# Patient Record
Sex: Male | Born: 1980 | Race: White | Hispanic: No | Marital: Married | Smoking: Never smoker
Health system: Southern US, Community
[De-identification: ages and names within clinical notes are randomized; demographics above are authoritative.]

## PROBLEM LIST (undated history)

## (undated) DIAGNOSIS — T7840XA Allergy, unspecified, initial encounter: Secondary | ICD-10-CM

## (undated) DIAGNOSIS — K219 Gastro-esophageal reflux disease without esophagitis: Secondary | ICD-10-CM

## (undated) HISTORY — PX: NASAL FRACTURE SURGERY: SHX718

## (undated) HISTORY — PX: VASECTOMY: SHX75

## (undated) HISTORY — DX: Allergy, unspecified, initial encounter: T78.40XA

## (undated) HISTORY — DX: Gastro-esophageal reflux disease without esophagitis: K21.9

---

## 2004-08-20 ENCOUNTER — Emergency Department (HOSPITAL_COMMUNITY): Admission: EM | Admit: 2004-08-20 | Discharge: 2004-08-20 | Payer: Self-pay | Admitting: Emergency Medicine

## 2006-03-30 ENCOUNTER — Emergency Department (HOSPITAL_COMMUNITY): Admission: EM | Admit: 2006-03-30 | Discharge: 2006-03-30 | Payer: Self-pay | Admitting: Emergency Medicine

## 2009-11-02 ENCOUNTER — Emergency Department (HOSPITAL_COMMUNITY): Admission: EM | Admit: 2009-11-02 | Discharge: 2009-11-02 | Payer: Self-pay | Admitting: Emergency Medicine

## 2011-04-30 ENCOUNTER — Ambulatory Visit: Payer: BC Managed Care – PPO

## 2011-04-30 ENCOUNTER — Ambulatory Visit (INDEPENDENT_AMBULATORY_CARE_PROVIDER_SITE_OTHER): Payer: BC Managed Care – PPO | Admitting: Family Medicine

## 2011-04-30 VITALS — BP 130/72 | HR 56 | Temp 98.6°F | Resp 16 | Ht 69.5 in | Wt 184.2 lb

## 2011-04-30 DIAGNOSIS — R079 Chest pain, unspecified: Secondary | ICD-10-CM

## 2011-04-30 DIAGNOSIS — R0789 Other chest pain: Secondary | ICD-10-CM

## 2011-04-30 DIAGNOSIS — M549 Dorsalgia, unspecified: Secondary | ICD-10-CM

## 2011-04-30 DIAGNOSIS — M546 Pain in thoracic spine: Secondary | ICD-10-CM

## 2011-04-30 MED ORDER — OXAPROZIN 600 MG PO TABS: ORAL_TABLET | ORAL | Status: DC

## 2011-04-30 NOTE — Progress Notes (Signed)
  Subjective:    Patient ID: Brendan Lawson, male    DOB: 1981-02-23, 31 y.o.   MRN: [id number]  HPI 31 year old white male who has been having upper back and chest pains since December or so. 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 He knows of no specific injury. He also works out at [employer] but that has never caused him any problems. He's been doing that for a long time. He has been doing the [employer] job for a long time also. When he gets up in the morning takes a deep breath it causes pain through the chest. He has not had any palpitations. No shortness of breath.   Review of Systems head neck abdomen all normal.     Objective:   Physical Exam  TMs normal. Throat clear. Neck supple without nodes palpable at this time. He said he felt a little knot on his left clavicle but I do not feel it. His chest is clear to auscultation heart regular without murmurs chest wall not particularly tender. He had a recent left axillary node but that is gone now.  [facility] reading (PRIMARY) by  Dr. Alwyn Ren Thoracic spine series was negative.  EKG has sinus bradycardia and mild sinus arrhythmia otherwise normal.     Assessment & Plan:  Upper thoracic and spine and chest pains, etiology uncertain.  EKG and thoracic spine films will be ordered and we will decide treatment accordingly.  He will be treated with NSAIDS. If he continues to have pain he may have to take a week off from work.

## 2011-04-30 NOTE — Patient Instructions (Signed)
Take Daypro 1 twice daily for 2 weeks. If pain persists we may have to have you take a little time off of work, but I hope that the medicine takes care of it. Current course.

## 2011-05-28 ENCOUNTER — Other Ambulatory Visit: Payer: Self-pay

## 2011-05-28 ENCOUNTER — Emergency Department (HOSPITAL_COMMUNITY): Payer: BC Managed Care – PPO

## 2011-05-28 ENCOUNTER — Encounter (HOSPITAL_COMMUNITY): Payer: Self-pay | Admitting: Emergency Medicine

## 2011-05-28 ENCOUNTER — Emergency Department (HOSPITAL_COMMUNITY)
Admission: EM | Admit: 2011-05-28 | Discharge: 2011-05-28 | Disposition: A | Discharge status: Home / Self Care | Payer: BC Managed Care – PPO | Attending: Emergency Medicine | Admitting: Emergency Medicine

## 2011-05-28 DIAGNOSIS — R799 Abnormal finding of blood chemistry, unspecified: Secondary | ICD-10-CM | POA: Insufficient documentation

## 2011-05-28 DIAGNOSIS — R079 Chest pain, unspecified: Secondary | ICD-10-CM | POA: Insufficient documentation

## 2011-05-28 DIAGNOSIS — R091 Pleurisy: Secondary | ICD-10-CM | POA: Insufficient documentation

## 2011-05-28 MED ORDER — HYDROCODONE-ACETAMINOPHEN 5-325 MG PO TABS: 1.0000 | ORAL_TABLET | Freq: Four times a day (QID) | ORAL | Status: AC | PRN

## 2011-05-28 MED ORDER — SODIUM CHLORIDE 0.9 % IV SOLN: INTRAVENOUS | Status: DC

## 2011-05-28 MED ORDER — IOHEXOL 300 MG/ML  SOLN
100.0000 mL | Freq: Once | INTRAMUSCULAR | Status: AC | PRN
  Administered 2011-05-28: 100 mL via INTRAVENOUS

## 2011-05-28 NOTE — Discharge Instructions (Signed)
Pleurisy  Pleurisy is an inflammation and swelling of the lining of the lungs. It usually is the result of an underlying infection or other disease. Because of this inflammation, it hurts to breathe. It is aggravated by coughing or deep breathing. The primary goal in treating pleurisy is to diagnose and treat the condition that caused it.   HOME CARE INSTRUCTIONS    Only take over-the-counter or prescription medicines for pain, discomfort, or fever as directed by your caregiver.   If medications which kill germs (antibiotics) were prescribed, take the entire course. Even if you are feeling better, you need to take them.   Use a cool mist vaporizer to help loosen secretions. This is so the secretions can be coughed up more easily.  SEEK MEDICAL CARE IF:    Your pain is not controlled with medication or is increasing.   You have an increase inpus like (purulent) secretions brought up with coughing.  SEEK IMMEDIATE MEDICAL CARE IF:    You have blue or dark lips, fingernails, or toenails.   You begin coughing up blood.   You have increased difficulty breathing.   You have continuing pain unrelieved by medicine or lasting more than 1 week.   You have pain that radiates into your neck, arms, or jaw.   You develop increased shortness of breath or wheezing.   You develop a fever, rash, vomiting, fainting, or other serious complaints.  Document Released: 03/07/2005 Document Revised: 02/24/2011 Document Reviewed: 10/06/2006  Exit[employer] Patient Information 2012 [employer].

## 2011-05-28 NOTE — ED Provider Notes (Signed)
History     CSN: [id number]  Arrival date & time 05/28/11  0101   First MD Initiated Contact with Patient 05/28/11 [phone]      Chief Complaint  Patient presents with  . Chest Pain    (Consider location/radiation/quality/duration/timing/severity/associated sxs/prior treatment) HPI This is a 31 year old white male with a 25 hour history of chest pain. He was playing video games just a morning at 3 AM when he developed the sudden onset of sharp, well localized pain just inferior to the right axilla. The pain is worse with palpation, movement or deep breathing. It is moderate to severe. He took some Naprosyn for it as well as 500 mg of amoxicillin. After taking the amoxicillin he he developed a itchy rash and thinks he may be allergic to the amoxicillin; he subsequently discarded the amoxicillin. He's been reading on the Internet and is concerned he may have a pulmonary embolism, bronchitis or pneumonia. He denies cough, fever or chills. He denies lower extremity pain or swelling.  History reviewed. No pertinent past medical history.  Past Surgical History  Procedure Date  . Nasal fracture surgery     Family History  Problem Relation Age of Onset  . Cancer Other     History  Substance Use Topics  . Smoking status: Former Games developer  . Smokeless tobacco: Not on file  . Alcohol Use: Yes     social       Review of Systems  All other systems reviewed and are negative.    Allergies  Amoxicillin  Home Medications   Current Outpatient Rx  Name Route Sig Dispense Refill  . AMOXICILLIN 500 MG PO CAPS Oral Take 500 mg by mouth 2 (two) times daily.    . OXAPROZIN 600 MG PO TABS Oral Take 600 mg by mouth 2 (two) times daily. Take 1 twice daily for back and chest wall pain and inflammation      BP 145/59  Pulse 72  Temp(Src) 98.7 F (37.1 C) (Oral)  Resp 16  SpO2 99%  Physical Exam General: Well-developed, well-nourished male in no acute distress; appearance consistent with  age of record HENT: normocephalic, atraumatic Eyes: Normal appearance Heart: regular rate and rhythm; no murmurs, rubs or gallops Lungs: clear to auscultation bilaterally Chest: Point tenderness inferior to right axilla without deformity or crepitus Abdomen: soft; nondistended; nontender Extremities: No deformity; full range of motion; pulses normal; no edema; no tenderness Neurologic: Awake, alert and oriented; motor function intact in all extremities and symmetric; no facial droop Skin: Warm and dry Psychiatric: Normal mood and affect    ED Course  Procedures (including critical care time)     MDM  EKG Interpretation:  Date & Time: 05/28/2011 1:27 AM  Rate: 75  Rhythm: normal sinus rhythm  QRS Axis: normal  Intervals: normal  ST/T Wave abnormalities: normal  Conduction Disutrbances:none  Narrative Interpretation:   Old EKG Reviewed: none available  Nursing notes and vitals signs, including pulse oximetry, reviewed.  Summary of this visit's results, reviewed by myself:  Labs:  Results for orders placed during the hospital encounter of 05/28/11  D-DIMER, QUANTITATIVE      Component Value Range   D-Dimer, Quant 11.36 (*) 0.00 - 0.48 (ug/mL-FEU)    Imaging Studies: Dg Chest 2 View  05/28/2011  *RADIOLOGY REPORT*  Clinical Data: Chest pain  CHEST - 2 VIEW  Comparison: None.  Findings: Lungs are clear. No pleural effusion or pneumothorax. The cardiomediastinal contours are within normal limits. The visualized bones  and soft tissues are without significant appreciable abnormality.  IMPRESSION: No acute process identified.  Original Report Authenticated By: Waneta Martins, M.D.   Ct Angio Chest W/cm &/or Wo Cm  05/28/2011  *RADIOLOGY REPORT*  Clinical Data: Chest pain, elevated D-dimer  CT ANGIOGRAPHY CHEST  Technique:  Multidetector CT imaging of the chest using the standard protocol during bolus administration of intravenous contrast. Multiplanar reconstructed images  including MIPs were obtained and reviewed to evaluate the vascular anatomy.  Contrast: OMNIPAQUE IOHEXOL 300 MG/ML IJ SOLN  Comparison: 05/28/2011 radiograph  Findings: No pulmonary arterial branch filling defect.  Normal heart size.  No pleural or pericardial effusion.  Normal course and caliber aorta.  No intrathoracic lymphadenopathy.  Clear lungs.  No pneumothorax.  Central airways are patent.  Limited images through the upper abdomen demonstrate no acute abnormality.  No acute osseous abnormality.  IMPRESSION: No pulmonary embolism or acute intrathoracic process identified.  Original Report Authenticated By: Waneta Martins, M.D.             Hanley Seamen, MD 05/28/11 [phone]

## 2011-05-28 NOTE — ED Notes (Signed)
Patient transported to CT 

## 2011-05-28 NOTE — ED Notes (Signed)
Patient transported to X-ray 

## 2011-05-28 NOTE — ED Notes (Signed)
Pt states he is having chest pain about 3am last night  Pt states it started suddenly  Pt states pain is worse with cough or take a deep breath  Pt states recently started taking oxaprozin 600mg  and he took some amoxicillin  Pt states he is having itching to his hands and feet

## 2012-04-25 ENCOUNTER — Emergency Department (HOSPITAL_COMMUNITY)
Admission: EM | Admit: 2012-04-25 | Discharge: 2012-04-25 | Disposition: A | Discharge status: Home / Self Care | Payer: BC Managed Care – PPO | Attending: Emergency Medicine | Admitting: Emergency Medicine

## 2012-04-25 ENCOUNTER — Encounter (HOSPITAL_COMMUNITY): Payer: Self-pay | Admitting: Emergency Medicine

## 2012-04-25 DIAGNOSIS — J039 Acute tonsillitis, unspecified: Secondary | ICD-10-CM | POA: Insufficient documentation

## 2012-04-25 MED ORDER — AZITHROMYCIN 250 MG PO TABS
500.0000 mg | ORAL_TABLET | Freq: Once | ORAL | Status: AC
  Administered 2012-04-25: 500 mg via ORAL
  Filled 2012-04-25: qty 2

## 2012-04-25 MED ORDER — AZITHROMYCIN 250 MG PO TABS: ORAL_TABLET | ORAL | Status: DC

## 2012-04-25 MED ORDER — PREDNISONE 20 MG PO TABS
60.0000 mg | ORAL_TABLET | Freq: Once | ORAL | Status: AC
  Administered 2012-04-25: 60 mg via ORAL
  Filled 2012-04-25: qty 3

## 2012-04-25 NOTE — ED Provider Notes (Signed)
Medical screening examination/treatment/procedure(s) were performed by non-physician practitioner and as supervising physician I was immediately available for consultation/collaboration.  Tallis Soledad, MD 04/25/12 1242 

## 2012-04-25 NOTE — ED Notes (Signed)
Pt reports that he was out of the country last week, and on a plane 5 days ago.reported GI discomfort and throat pain since he arrived home

## 2012-04-25 NOTE — ED Notes (Signed)
Pt reports throat pain x 2 days.Pt was not able to eat today

## 2012-04-25 NOTE — ED Provider Notes (Signed)
History     CSN: [id number]  Arrival date & time 04/25/12  1057   First MD Initiated Contact with Patient 04/25/12 1116      Chief Complaint  Patient presents with  . Sore Throat    pain in throat x 2 days    (Consider location/radiation/quality/duration/timing/severity/associated sxs/prior treatment) HPI Comments: 32 year old male presents to the emergency department complaining of sore throat beginning 5 days ago. Pain located on the right side of his throat, rated 8/10 worse with swallowing. Admits to associated fever and chills. Denies cough, chest pain, shortness of breath or GI symptoms. He recently returned from [place] 4 days ago. While he was in [place] he had "a GI bug". He has tried taking Alka-Seltzer, Motrin and Sudafed without relief.  Patient is a 31 y.o. male presenting with pharyngitis. The history is provided by the patient.  Sore Throat Associated symptoms include a sore throat. Pertinent negatives include no arthralgias, chest pain, myalgias, neck pain or rash.    History reviewed. No pertinent past medical history.  Past Surgical History  Procedure Date  . Nasal fracture surgery     Family History  Problem Relation Age of Onset  . Cancer Other     History  Substance Use Topics  . Smoking status: Never Smoker   . Smokeless tobacco: Not on file  . Alcohol Use: Yes     Comment: social       Review of Systems  HENT: Positive for sore throat and trouble swallowing. Negative for neck pain and neck stiffness.   Respiratory: Negative for shortness of breath.   Cardiovascular: Negative for chest pain.  Musculoskeletal: Negative for myalgias, back pain and arthralgias.  Skin: Negative for rash.  All other systems reviewed and are negative.    Allergies  Amoxicillin  Home Medications   Current Outpatient Rx  Name  Route  Sig  Dispense  Refill  . ASPIRIN EFFERVESCENT 325 MG PO TBEF   Oral   Take 325 mg by mouth every 6 (six) hours as needed.  Cold symptoms         . IBUPROFEN 200 MG PO TABS   Oral   Take 200 mg by mouth every 6 (six) hours as needed. pain         . PSEUDOEPHEDRINE HCL 30 MG PO TABS   Oral   Take 60 mg by mouth every 4 (four) hours as needed. Cold symptoms         . AZITHROMYCIN 250 MG PO TABS      Take 1 qd   4 tablet   0     BP 102/78  Pulse 104  Temp 99.6 F (37.6 C) (Oral)  Resp 20  SpO2 99%  Physical Exam  Nursing note and vitals reviewed. Constitutional: He is oriented to person, place, and time. He appears well-developed and well-nourished. No distress.  HENT:  Head: Normocephalic and atraumatic.  Mouth/Throat: Uvula is midline and mucous membranes are normal. Oropharyngeal exudate, posterior oropharyngeal edema and posterior oropharyngeal erythema present. No tonsillar abscesses.       R tonsil enlarged +3 with exudate. No tonsillar abscess.  Eyes: Conjunctivae normal and EOM are normal.  Neck: Normal range of motion. Neck supple.  Cardiovascular: Regular rhythm and normal heart sounds.  Tachycardia present.   Pulmonary/Chest: Effort normal and breath sounds normal. He has no wheezes. He has no rhonchi.  Musculoskeletal: Normal range of motion. He exhibits no edema.  Lymphadenopathy:    He has  cervical adenopathy.       Right cervical: Superficial cervical adenopathy present.  Neurological: He is alert and oriented to person, place, and time.  Skin: Skin is warm and dry.  Psychiatric: He has a normal mood and affect. His behavior is normal.    ED Course  Procedures (including critical care time)   Labs Reviewed  RAPID STREP SCREEN   No results found.   1. Tonsillitis       MDM  32 year old male with right-sided tonsillitis without evidence of abscess. Patient has a low-grade fever, no cough and tonsillar exit 8 meeting [facility] criteria treat for strep throat. He is allergic to amoxicillin, therefore I will place him on azithromycin. Return precautions discussed.  Patient states understanding of plan and is agreeable.        Trevor Mace, PA-C 04/25/12 1207  Trevor Mace, PA-C 04/25/12 1207

## 2012-08-06 IMAGING — CR DG CHEST 2V
3 series · 3 of 3 positions shown · non-contrast
Comparison: None.

CLINICAL DATA: Chest pain

CHEST - 2 VIEW

[w chest pa]
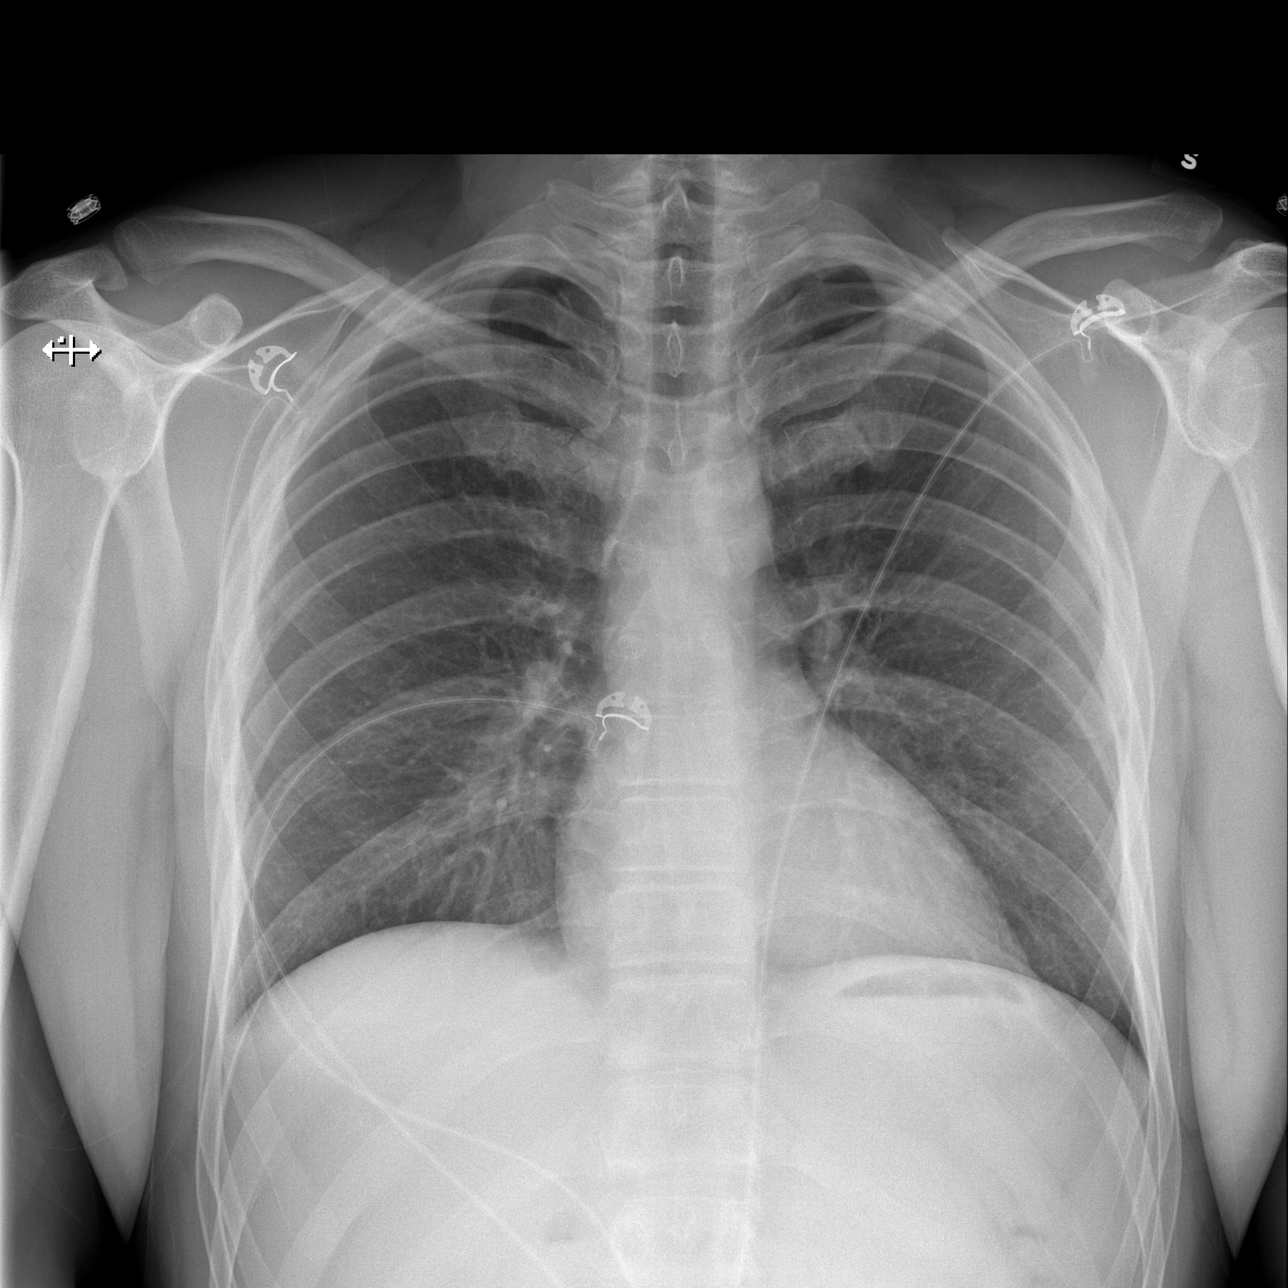

[w chest lat (1 of 2)]
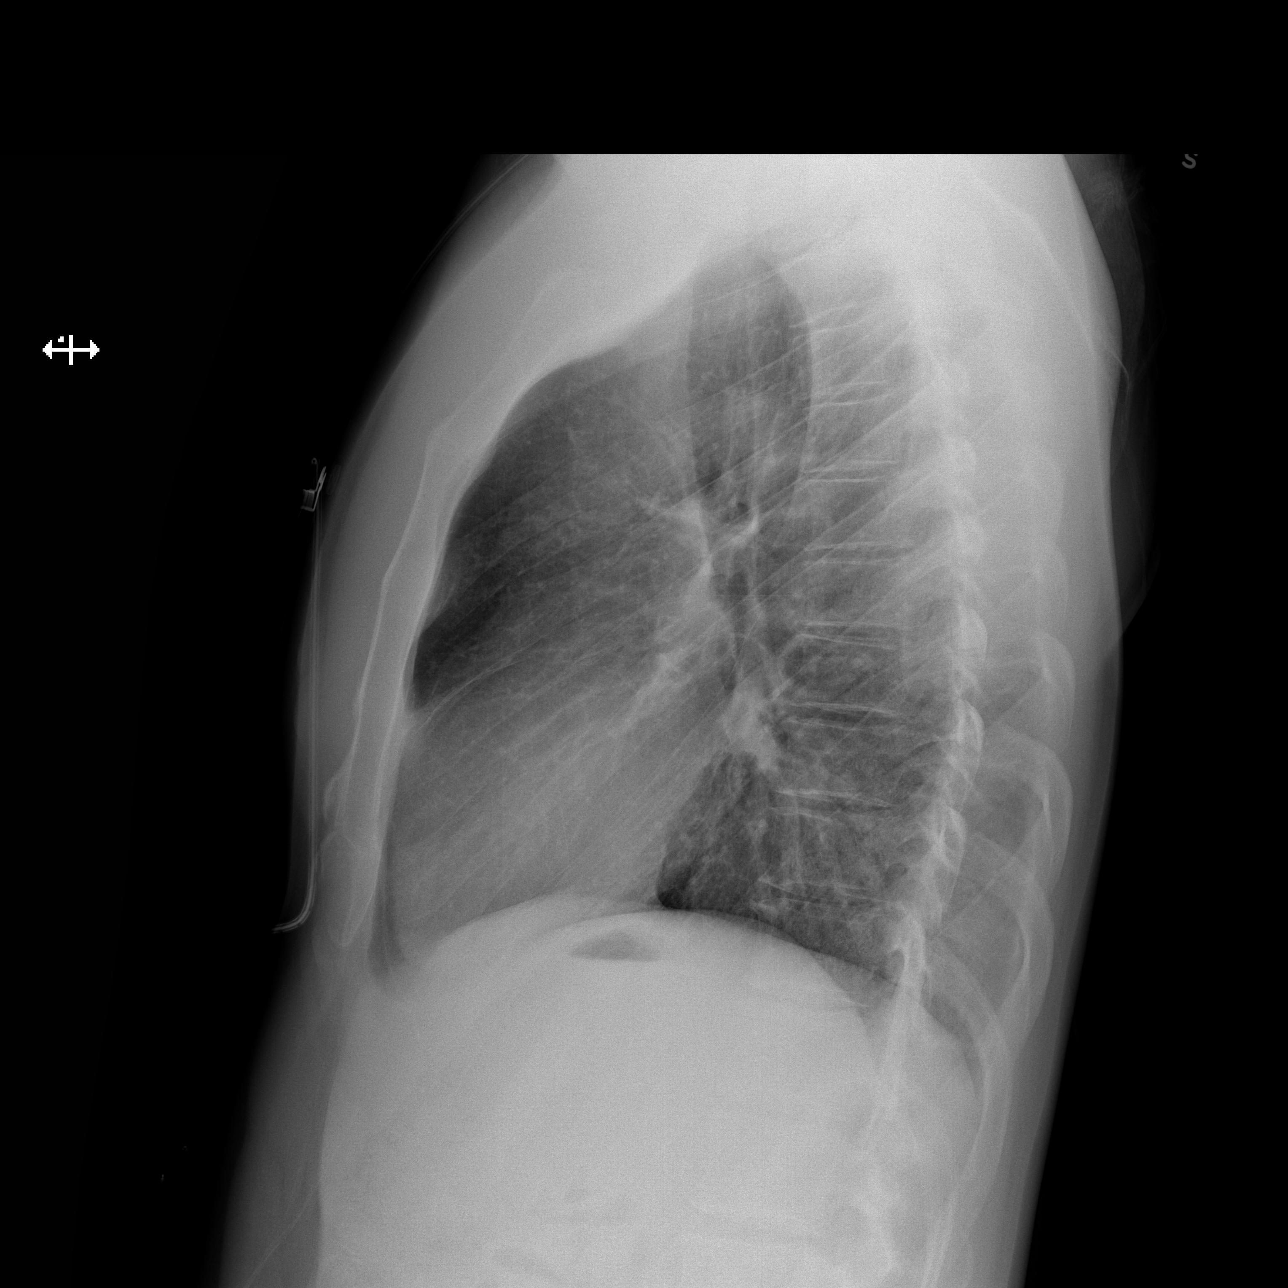

[w chest lat (2 of 2)]
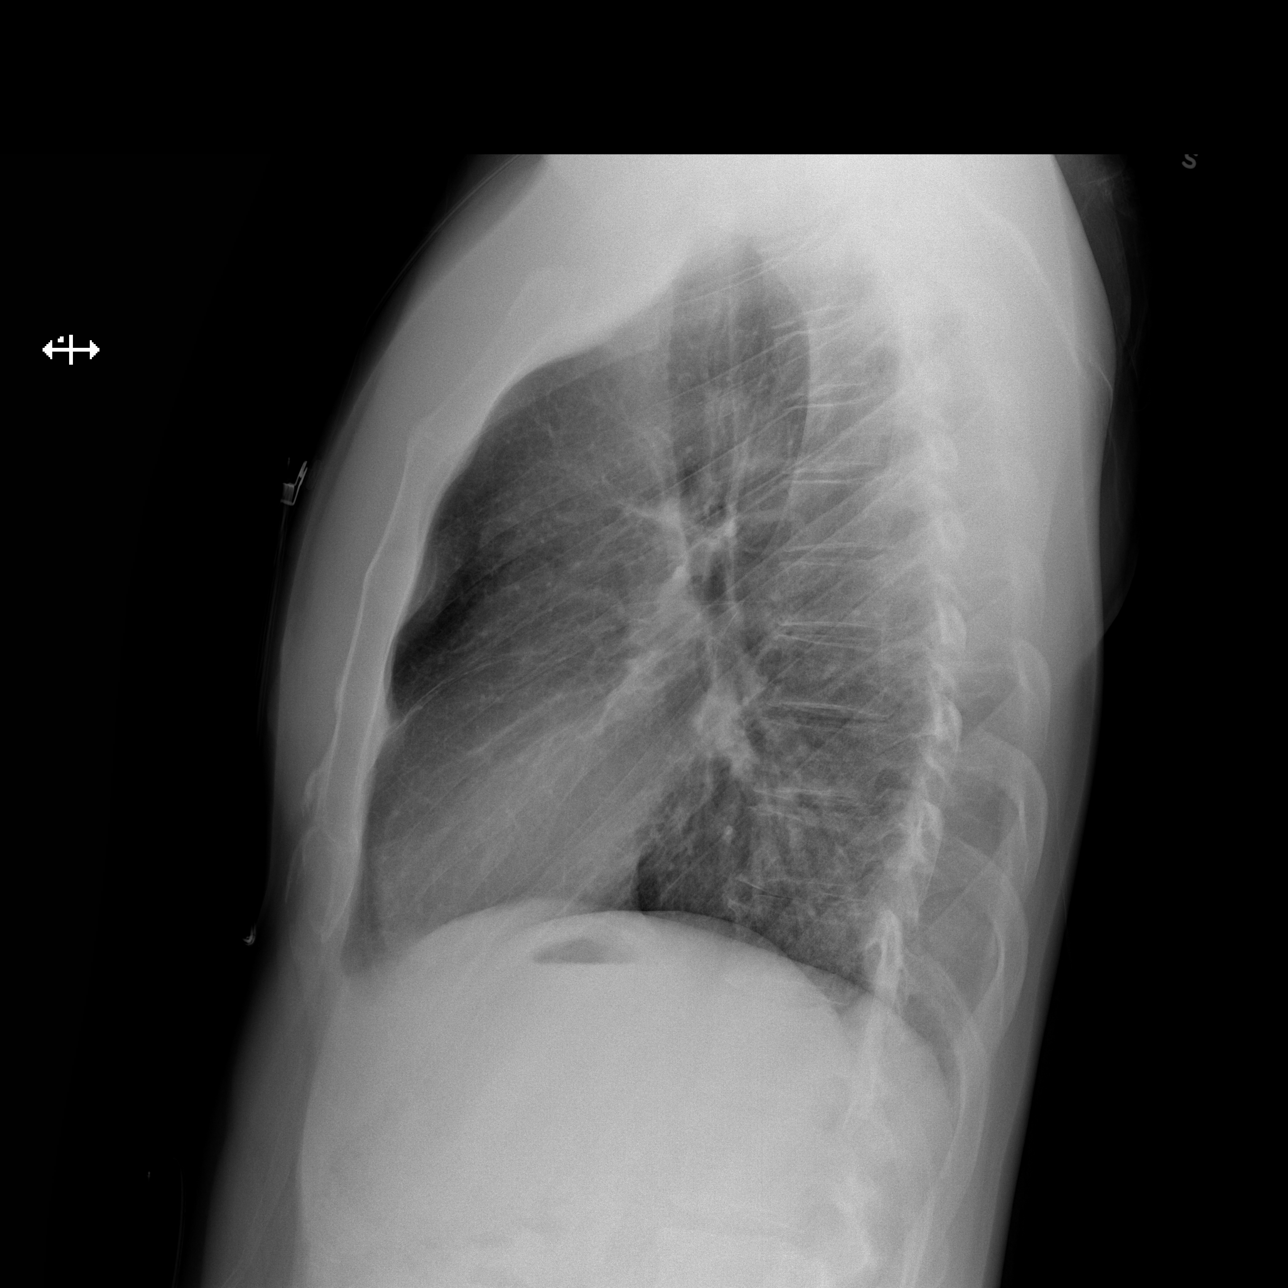

[3 of 3 positions shown; findings below may reference images not displayed]

FINDINGS: Lungs are clear. No pleural effusion or pneumothorax. The
cardiomediastinal contours are within normal limits. The visualized
bones and soft tissues are without significant appreciable
abnormality.
IMPRESSION: No acute process identified.

## 2013-01-15 ENCOUNTER — Ambulatory Visit (INDEPENDENT_AMBULATORY_CARE_PROVIDER_SITE_OTHER): Payer: BC Managed Care – PPO | Admitting: Emergency Medicine

## 2013-01-15 VITALS — BP 120/82 | HR 68 | Temp 98.1°F | Resp 16 | Ht 69.5 in | Wt 183.8 lb

## 2013-01-15 DIAGNOSIS — Z23 Encounter for immunization: Secondary | ICD-10-CM

## 2013-01-15 DIAGNOSIS — K120 Recurrent oral aphthae: Secondary | ICD-10-CM

## 2013-01-15 MED ORDER — FIRST-DUKES MOUTHWASH MT SUSP: 10.0000 mL | Freq: Four times a day (QID) | OROMUCOSAL | Status: DC

## 2013-01-15 MED ORDER — HYDROCODONE-ACETAMINOPHEN 5-325 MG PO TABS: 1.0000 | ORAL_TABLET | ORAL | Status: DC | PRN

## 2013-01-15 NOTE — Patient Instructions (Signed)
Canker Sores   Canker sores are painful, open sores on the inside of the mouth and cheek. They may be white or yellow. The sores usually heal in 1 to 2 weeks. Women are more likely than men to have recurrent canker sores.  CAUSES  The cause of canker sores is not well understood. More than one cause is likely. Canker sores do not appear to be caused by certain types of germs (viruses or bacteria). Canker sores may be caused by:   An allergic reaction to certain foods.   Digestive problems.   Not having enough vitamin B12, folic acid, and iron.   Male sex hormones. Sores may come only during certain phases of a menstrual cycle. Often, there is improvement during pregnancy.   Genetics. Some people seem to inherit canker sore problems.  Emotional stress and injuries to the mouth may trigger outbreaks, but not cause them.   DIAGNOSIS  Canker sores are diagnosed by exam.   TREATMENT   Patients who have frequent bouts of canker sores may have cultures taken of the sores, blood tests, or allergy tests. This helps determine if their sores are caused by a poor diet, an allergy, or some other preventable or treatable disease.   Vitamins may prevent recurrences or reduce the severity of canker sores in people with poor nutrition.   Numbing ointments can relieve pain. These are available in drug stores without a prescription.   Anti-inflammatory steroid mouth rinses or gels may be prescribed by your caregiver for severe sores.   Oral steroids may be prescribed if you have severe, recurrent canker sores. These strong medicines can cause many side effects and should be used only under the close direction of a dentist or physician.   Mouth rinses containing the antibiotic medicine may be prescribed. They may lessen symptoms and speed healing.  Healing usually happens in about 1 or 2 weeks with or without treatment. Certain antibiotic mouth rinses given to pregnant women and young children can permanently stain teeth.  Talk to your caregiver about your treatment.  HOME CARE INSTRUCTIONS    Avoid foods that cause canker sores for you.   Avoid citrus juices, spicy or salty foods, and coffee until the sores are healed.   Use a soft-bristled toothbrush.   Chew your food carefully to avoid biting your cheek.   Apply topical numbing medicine to the sore to help relieve pain.   Apply a thin paste of baking soda and water to the sore to help heal the sore.   Only use mouth rinses or medicines for pain or discomfort as directed by your caregiver.  SEEK MEDICAL CARE IF:    Your symptoms are not better in 1 week.   Your sores are still present after 2 weeks.   Your sores are very painful.   You have trouble breathing or swallowing.   Your sores come back frequently.  Document Released: 07/02/2010 Document Revised: 05/30/2011 Document Reviewed: 07/02/2010  [employer] Patient Information 2014 [employer].

## 2013-01-15 NOTE — Progress Notes (Signed)
[facility] [address], [place] [address] [phone]  Date:  01/15/2013   Name:  Brendan Lawson   DOB:  09/29/1980   MRN:  [id number]  PCP:  No primary provider on file.    Chief Complaint: Sore Throat and Groin Pain   History of Present Illness:  Brendan Lawson is a 32 y.o. very pleasant male patient who presents with the following:  Ill with sore throat started Thursday.  No cough or coryza.  No fever or chills.  Says pain is in the right side only and is associated with pressure in his right ear.  History of recurrent strep infections.  No improvement with over the counter medications or other home remedies. Has some pain in his left groin after lifting.  Denies other complaint or health concern today.   There are no active problems to display for this patient.   Past Medical History  Diagnosis Date  . Allergy     Past Surgical History  Procedure Laterality Date  . Nasal fracture surgery      History  Substance Use Topics  . Smoking status: Never Smoker   . Smokeless tobacco: Not on file  . Alcohol Use: Yes     Comment: social     Family History  Problem Relation Age of Onset  . Cancer Other   . Cancer Mother     breast   . Cancer Father     skin  . Cancer Maternal Grandfather     throat   . Heart disease Paternal Grandmother     Allergies  Allergen Reactions  . Amoxicillin [Amoxicillin] Hives    Medication list has been reviewed and updated.  Current Outpatient Prescriptions on File Prior to Visit  Medication Sig Dispense Refill  . aspirin-sod bicarb-citric acid (ALKA-SELTZER) 325 MG TBEF Take 325 mg by mouth every 6 (six) hours as needed. Cold symptoms      . azithromycin (ZITHROMAX Z-PAK) 250 MG tablet Take 1 qd  4 tablet  0  . ibuprofen (ADVIL,MOTRIN) 200 MG tablet Take 200 mg by mouth every 6 (six) hours as needed. pain      . pseudoephedrine (SUDAFED) 30 MG tablet Take 60 mg by mouth every 4 (four) hours as needed. Cold  symptoms       No current facility-administered medications on file prior to visit.    Review of Systems:  As per HPI, otherwise negative.    Physical Examination: Filed Vitals:   01/15/13 1506  BP: 120/82  Pulse: 68  Temp: 98.1 F (36.7 C)  Resp: 16   Filed Vitals:   01/15/13 1506  Height: 5' 9.5" (1.765 m)  Weight: 183 lb 12.8 oz (83.371 kg)   Body mass index is 26.76 kg/(m^2). Ideal Body Weight: Weight in (lb) to have BMI = 25: 171.4  GEN: WDWN, NAD, Non-toxic, A & O x 3 HEENT: Atraumatic, Normocephalic. Neck supple. No masses, No LAD.  Right tonsil is erythematous and not enlarged with a large aphthous ulceration. Ears and Nose: No external deformity. CV: RRR, No M/G/R. No JVD. No thrill. No extra heart sounds. PULM: CTA B, no wheezes, crackles, rhonchi. No retractions. No resp. distress. No accessory muscle use. ABD: S, NT, ND, +BS. No rebound. No HSM. EXTR: No c/c/e NEURO Normal gait.  PSYCH: Normally interactive. Conversant. Not depressed or anxious appearing.  Calm demeanor.  Genitalia: normal male circumcised. No hernia  Assessment and Plan: Aphthous stomatitis vicodin [employer]'s  magic mouthwash Flu shot Signed,  Phillips Odor, MD

## 2013-03-19 ENCOUNTER — Emergency Department (HOSPITAL_COMMUNITY)
Admission: EM | Admit: 2013-03-19 | Discharge: 2013-03-19 | Disposition: A | Discharge status: Home / Self Care | Payer: BC Managed Care – PPO | Attending: Emergency Medicine | Admitting: Emergency Medicine

## 2013-03-19 DIAGNOSIS — M545 Low back pain, unspecified: Secondary | ICD-10-CM | POA: Insufficient documentation

## 2013-03-19 DIAGNOSIS — Z79899 Other long term (current) drug therapy: Secondary | ICD-10-CM | POA: Insufficient documentation

## 2013-03-19 DIAGNOSIS — Z880 Allergy status to penicillin: Secondary | ICD-10-CM | POA: Insufficient documentation

## 2013-03-19 MED ORDER — IBUPROFEN 800 MG PO TABS: 800.0000 mg | ORAL_TABLET | Freq: Three times a day (TID) | ORAL | Status: DC | PRN

## 2013-03-19 MED ORDER — HYDROCODONE-ACETAMINOPHEN 5-325 MG PO TABS: 1.0000 | ORAL_TABLET | ORAL | Status: DC | PRN

## 2013-03-19 MED ORDER — METHOCARBAMOL 500 MG PO TABS: 500.0000 mg | ORAL_TABLET | Freq: Three times a day (TID) | ORAL | Status: DC | PRN

## 2013-03-19 NOTE — ED Provider Notes (Signed)
CSN: [id number]     Arrival date & time 03/19/13  1447 History  This chart was scribed for non-physician practitioner Trixie Dredge, PA-C working with Gerhard Munch, MD by Valera Castle, ED scribe. This patient was seen in room WTR5/WTR5 and the patient's care was started at 3:56 PM.   Chief Complaint  Patient presents with  . Back Pain     The history is provided by the patient. No language interpreter was used.   HPI Comments: Brendan Lawson is a 32 y.o. male who presents to the Emergency Department complaining of gradually worsening, severe, intermittent, central back pain, onset 1 week ago. He reports that at its worst the pain is a 10/10 severity. He denies any recent back injury. He reports a h/o back pain, including hearing popping sounds with different activities, but states that the pain usually goes away in 1 week. He reports that his pain this time has not subsided, and that yesterday and today have been the worst days pain wise this week. He states that movement, bending over exacerbates his back pain. He reports that he has been working a lot, doing double shifts. He states that his torso is crooked, bulging. He reports that nothing he has tried has helped relieve his abdominal asymmetry. He reports he has tried going to a chiropractor, with no relief, stating the pain neither improved or worsened. He reports taking Motrin yesterday and Hydrocodone today, with some relief stating that the pain severity is lowered to a 7/10. He denies fever, bladder and bowel incontinence, numbness, weakness, abdominal pain, hematuria, urinary frequency, and any other associated symptoms. He denies h/o IV drug use. He denies h/o cancer.   PCP - No primary provider on file.  Past Medical History  Diagnosis Date  . Allergy    Past Surgical History  Procedure Laterality Date  . Nasal fracture surgery     Family History  Problem Relation Age of Onset  . Cancer Other   . Cancer Mother     breast   .  Cancer Father     skin  . Cancer Maternal Grandfather     throat   . Heart disease Paternal Grandmother    History  Substance Use Topics  . Smoking status: Never Smoker   . Smokeless tobacco: Not on file  . Alcohol Use: Yes     Comment: social     Review of Systems  Constitutional: Negative for fever.  Gastrointestinal: Negative for abdominal pain.       Negative for bowel incontinence.  Genitourinary: Negative for frequency and hematuria.       Negative for bladder incontinence.  Musculoskeletal: Positive for back pain.  Neurological: Negative for weakness and numbness.    Allergies  Amoxicillin  Home Medications   Current Outpatient Rx  Name  Route  Sig  Dispense  Refill  . aspirin-sod bicarb-citric acid (ALKA-SELTZER) 325 MG TBEF   Oral   Take 325 mg by mouth every 6 (six) hours as needed. Cold symptoms         . azithromycin (ZITHROMAX Z-PAK) 250 MG tablet      Take 1 qd   4 tablet   0   . Diphenhyd-Hydrocort-Nystatin (FIRST-DUKES MOUTHWASH) SUSP   Mouth/Throat   Use as directed 10 mLs in the mouth or throat 4 (four) times daily.   237 mL   1   . HYDROcodone-acetaminophen (NORCO) 5-325 MG per tablet   Oral   Take 1-2 tablets by mouth  every 4 (four) hours as needed for pain.   30 tablet   0   . ibuprofen (ADVIL,MOTRIN) 200 MG tablet   Oral   Take 200 mg by mouth every 6 (six) hours as needed. pain         . pseudoephedrine (SUDAFED) 30 MG tablet   Oral   Take 60 mg by mouth every 4 (four) hours as needed. Cold symptoms          BP 122/79  Pulse 63  Temp(Src) 98.1 F (36.7 C) (Oral)  SpO2 98%  Physical Exam  Nursing note and vitals reviewed. Constitutional: He appears well-developed and well-nourished. No distress.  HENT:  Head: Normocephalic and atraumatic.  Neck: Neck supple.  Pulmonary/Chest: Effort normal.  Abdominal: Soft. There is no tenderness.  Musculoskeletal: Normal range of motion.  Spine nontender, no crepitus, or  stepoffs. Right paralumbarspinal spasm.  Neurological: He is alert. Gait normal.  Lower extremities:  Strength 5/5, sensation intact, distal pulses intact.    Skin: He is not diaphoretic.    ED Course  Procedures (including critical care time)  DIAGNOSTIC STUDIES: Oxygen Saturation is 98% on room air, normal by my interpretation.    COORDINATION OF CARE: 4:01 PM-Discussed treatment plan which includes muscle relaxer with pt at bedside and pt agreed to plan. Advised pt to f/u with PCP if pain worsens.   Labs Review Labs Reviewed - No data to display Imaging Review No results found.  EKG Interpretation   None      No orders of the defined types were placed in this encounter.    MDM   1. Low back pain    Pt with lower back pain, worse with movement, resulting from several days of double shifts at [employer] and heavy lifting.  Neurovascuarly intact.  No red flags.  Not worsened by chiropractic involvement, doubt injury from this.  Discussed result, findings, treatment, and follow up  with patient.  Pt given return precautions.  Pt verbalizes understanding and agrees with plan.        I personally performed the services described in this documentation, which was scribed in my presence. The recorded information has been reviewed and is accurate.   Trixie Dredge, PA-C 03/19/13 1728

## 2013-03-19 NOTE — ED Provider Notes (Signed)
  Medical screening examination/treatment/procedure(s) were performed by non-physician practitioner and as supervising physician I was immediately available for consultation/collaboration.  EKG Interpretation   None          Gerhard Munch, MD 03/19/13 2123

## 2013-03-19 NOTE — ED Notes (Signed)
Pt states that he has had back pain in the past but it usually goes away in one week. States it has been going on one week and his R calf cramps up.

## 2013-03-22 ENCOUNTER — Encounter (HOSPITAL_COMMUNITY): Payer: Self-pay | Admitting: Emergency Medicine

## 2013-05-17 ENCOUNTER — Ambulatory Visit (INDEPENDENT_AMBULATORY_CARE_PROVIDER_SITE_OTHER): Payer: BC Managed Care – PPO | Admitting: Family Medicine

## 2013-05-17 VITALS — BP 118/72 | HR 72 | Temp 97.8°F | Resp 18 | Ht 69.5 in | Wt 189.0 lb

## 2013-05-17 DIAGNOSIS — J0100 Acute maxillary sinusitis, unspecified: Secondary | ICD-10-CM

## 2013-05-17 MED ORDER — IBUPROFEN 800 MG PO TABS: 800.0000 mg | ORAL_TABLET | Freq: Three times a day (TID) | ORAL | Status: DC | PRN

## 2013-05-17 MED ORDER — DOXYCYCLINE HYCLATE 100 MG PO CAPS: 100.0000 mg | ORAL_CAPSULE | Freq: Two times a day (BID) | ORAL | Status: DC

## 2013-05-17 MED ORDER — IPRATROPIUM BROMIDE 0.03 % NA SOLN: 2.0000 | Freq: Two times a day (BID) | NASAL | Status: DC

## 2013-05-17 NOTE — Progress Notes (Signed)
Subjective:    Patient ID: Brendan Lawson, male    DOB: [birth year], 33 y.o.   MRN: [id number] This chart was scribed for Ethelda Chick, MD by Valera Castle, ED Scribe. This patient was seen in room 03 and the patient's care was started at 10:35 AM.  05/17/2013  Sinusitis  HPI Brendan Lawson is a 33 y.o. male who presents to the [facility] with sinusitis symptoms, including dry cough and rhinorrhea with thick drainage, onset 2 weeks ago. He reports recently visiting [place], [place], stating flying exacerbated his symptoms. He reports cough and rhinorrhea have persisted since returning from his trip, and reports associated sinus pressure/pain mainly on the left side, watery eyes, bilateral ear popping, headache, mild right sided sore throat, and SOB. He reports h/o intermittent mouth sores/blisters, has had some recently. His wife states he was red and warm last night, but is not sure if he had fever.   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 Pt reports trying Mucinex before work a few days ago, with little relief, but states he only took 2 tablets. He took 800 mg Motrin this morning, with some relief. He reports feeling better today, including his eyes, ears, and sinuses. He denies chills, sweats, and any other associated symptoms.   He also reports h/o of a small bead sized lump underneath his right jaw. He has h/o nose fx surgery. He reports his mother has h/o breast cancer and his father has h/o skin cancer. He recently got married, is without kids. He denies h/o smoking, drug use, but reports ocassional EtOH use.  Review of Systems  Constitutional: Negative for fever, chills and diaphoresis.  HENT: Positive for congestion, postnasal drip, rhinorrhea, sinus pressure and sore throat (mainly right sided). Negative for ear discharge, hearing loss and trouble swallowing.        + for bilateral ear popping  Eyes: Positive for discharge (watery). Negative  for photophobia, pain and visual disturbance.  Respiratory: Positive for cough and shortness of breath.   Gastrointestinal: Negative for nausea, vomiting and abdominal pain.  Musculoskeletal: Negative for myalgias.  Skin: Negative for rash.  Allergic/Immunologic: Positive for environmental allergies.  Neurological: Positive for headaches.    Past Medical History  Diagnosis Date  . Allergy    Allergies  Allergen Reactions  . Amoxicillin [Amoxicillin] Hives   Current Outpatient Prescriptions  Medication Sig Dispense Refill  . guaiFENesin (MUCINEX) 600 MG 12 hr tablet Take by mouth 2 (two) times daily.      . Multiple Vitamin (MULTIVITAMIN WITH MINERALS) TABS tablet Take 1 tablet by mouth daily.      Marland Kitchen doxycycline (VIBRAMYCIN) 100 MG capsule Take 1 capsule (100 mg total) by mouth 2 (two) times daily.  20 capsule  0  . ibuprofen (ADVIL,MOTRIN) 800 MG tablet Take 1 tablet (800 mg total) by mouth every 8 (eight) hours as needed.  30 tablet  0  . ipratropium (ATROVENT) 0.03 % nasal spray Place 2 sprays into the nose 2 (two) times daily.  30 mL  0   No current facility-administered medications for this visit.   History   Social History  . Marital Status: Married    Spouse Name: N/A    Number of Children: N/A  . Years of Education: N/A   Occupational History  . Not on file.   Social History Main Topics  . Smoking status: Never Smoker   . Smokeless tobacco: Not  on file  . Alcohol Use: Yes     Comment: social   . Drug Use: Yes    Special: Marijuana  . Sexual Activity: Not on file   Other Topics Concern  . 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      Tobacco: none      Alcohol:  Socially; weekends      Drugs: none       Objective:    BP 118/72  Pulse 72  Temp(Src) 97.8 F (36.6 C) (Oral)  Resp 18  Ht 5' 9.5" (1.765 m)  Wt 189 lb (85.73 kg)  BMI 27.52  kg/m2  SpO2 97%  Physical Exam  Nursing note and vitals reviewed. Constitutional: He is oriented to person, place, and time. He appears well-developed and well-nourished. No distress.  HENT:  Head: Normocephalic and atraumatic.  Right Ear: External ear normal.  Left Ear: External ear normal.  Nose: Right sinus exhibits maxillary sinus tenderness and frontal sinus tenderness. Left sinus exhibits maxillary sinus tenderness and frontal sinus tenderness.  Mouth/Throat: Uvula is midline and mucous membranes are normal. No oropharyngeal exudate or posterior oropharyngeal erythema.  Drainage in back of throat, no erythema, exudate. Diffuse sinus tenderness frontal and maxillary.   Eyes: Conjunctivae and EOM are normal. No scleral icterus.  Neck: Neck supple. No thyromegaly present.  Cardiovascular: Normal rate, regular rhythm and normal heart sounds.   No murmur heard. Pulmonary/Chest: Effort normal and breath sounds normal. No respiratory distress. He has no wheezes. He has no rales.  Musculoskeletal: Normal range of motion.  Lymphadenopathy:    He has no cervical adenopathy.  Neurological: He is alert and oriented to person, place, and time.  Skin: Skin is warm and dry.  Psychiatric: He has a normal mood and affect. His behavior is normal.       Assessment & Plan:  Acute maxillary sinusitis  1.  Acute Maxillary Sinusitis:  New.  Rx for Doxycycline, Atrovent nasal spray provided. Continue Mucinex bid.  RTC for persistent symptoms.  Continue Ibuprofen 800mg  tid; rx provided.  Meds ordered this encounter  Medications  . guaiFENesin (MUCINEX) 600 MG 12 hr tablet    Sig: Take by mouth 2 (two) times daily.  Marland Kitchen. doxycycline (VIBRAMYCIN) 100 MG capsule    Sig: Take 1 capsule (100 mg total) by mouth 2 (two) times daily.    Dispense:  20 capsule    Refill:  0  . ipratropium (ATROVENT) 0.03 % nasal spray    Sig: Place 2 sprays into the nose 2 (two) times daily.    Dispense:  30 mL    Refill:   0  . ibuprofen (ADVIL,MOTRIN) 800 MG tablet    Sig: Take 1 tablet (800 mg total) by mouth every 8 (eight) hours as needed.    Dispense:  30 tablet    Refill:  0    No Follow-up on file.  I personally performed the services described in this documentation, which was scribed in my presence.  The recorded information has been reviewed and is accurate.  Nilda SimmerKristi Robertta Halfhill, M.D.  [facility]  [place] [address] [place], [place]  [id number] [phone] phone [phone] fax

## 2013-05-17 NOTE — Patient Instructions (Signed)

## 2016-02-17 ENCOUNTER — Ambulatory Visit (INDEPENDENT_AMBULATORY_CARE_PROVIDER_SITE_OTHER): Payer: BLUE CROSS/BLUE SHIELD | Admitting: Family Medicine

## 2016-02-17 ENCOUNTER — Ambulatory Visit (INDEPENDENT_AMBULATORY_CARE_PROVIDER_SITE_OTHER): Payer: BLUE CROSS/BLUE SHIELD

## 2016-02-17 VITALS — BP 148/76 | HR 76 | Temp 98.1°F | Ht 69.5 in | Wt 194.4 lb

## 2016-02-17 DIAGNOSIS — R059 Cough, unspecified: Secondary | ICD-10-CM

## 2016-02-17 DIAGNOSIS — R05 Cough: Secondary | ICD-10-CM

## 2016-02-17 NOTE — Patient Instructions (Addendum)
Take Alka selzer Cold and flu for cold symptoms Try Honey for sore throat Your Chest xray was normal If you have worsening fevers or symptoms return for evalution    IF you received an x-ray today, you will receive an invoice from [facility]. Please contact [facility] at [phone] with questions or concerns regarding your invoice.   IF you received labwork today, you will receive an invoice from [facility]. Please contact [facility] at [phone] with questions or concerns regarding your invoice.   Our billing staff will not be able to assist you with questions regarding bills from these companies.  You will be contacted with the lab results as soon as they are available. The fastest way to get your results is to activate your My Chart account. Instructions are located on the last page of this paperwork. If you have not heard from us regarding the results in 2 weeks, please contact this office.

## 2016-02-17 NOTE — Progress Notes (Signed)
   Brendan Lawson is a 35 y.o. male who presents to [facility] today for cough, congestion, throat pain  1.  Throat pain - last night turned his neck over the side of the bed, unsure if he leaned his throat against the corner of the bed and coughed. He then started to have neck pain - has mild sore throat 5 days ago when his other symptoms started but it had resolved - this AM he continued to have pain tenderness over his larynx - no fevers - no increased drooling, no difficulty swallowing - has been able to tolerate breakfast this AM  normally  2. Cough, congestion - started 5 days ago - as above no fevers - his son ws sick with similor symptoms a week prior - denies headache  - denies SOB   ROS as above.  Pertinently, no chest pain, palpitations,  Fever, Chills, Abd pain, N/V/D.   PMH reviewed. Patient is a nonsmoker.   Past Medical History:  Diagnosis Date  . Allergy    Past Surgical History:  Procedure Laterality Date  . NASAL FRACTURE SURGERY      Medications reviewed. Current Outpatient Prescriptions  Medication Sig Dispense Refill  . Multiple Vitamin (MULTIVITAMIN WITH MINERALS) TABS tablet Take 1 tablet by mouth daily.    Marland Kitchen. ibuprofen (ADVIL,MOTRIN) 800 MG tablet Take 1 tablet (800 mg total) by mouth every 8 (eight) hours as needed. (Patient not taking: Reported on 02/17/2016) 30 tablet 0  . ipratropium (ATROVENT) 0.03 % nasal spray Place 2 sprays into the nose 2 (two) times daily. (Patient not taking: Reported on 02/17/2016) 30 mL 0   No current facility-administered medications for this visit.      Physical Exam:  BP (!) 148/76 (BP Location: Right Arm, Patient Position: Sitting, Cuff Size: Small)   Pulse 76   Temp 98.1 F (36.7 C) (Oral)   Ht 5' 9.5" (1.765 m)   Wt 194 lb 6.4 oz (88.2 kg)   SpO2 97%   BMI 28.30 kg/m  Gen:  Alert, cooperative patient who appears stated age in no acute distress.  Vital signs reviewed. HEENT: EOMI,  MMM,  normal TMs and ear canals bilaterally, mo pharnngeal erythema, midline uvular, no pharyngeal swelling or exudates Pulm:  Clear to auscultation bilaterally with good air movement.  No wheezes or rales noted.   Cardiac:  Regular rate and rhythm without murmur auscultated.  Good S1/S2. MSK: mild midline laryngeal point tenderness, no ovelying erythema or induration Abd:  Soft/nondistended/nontender.  Good bowel sounds throughout all four quadrants.  No masses noted.  Exts: Non edematous BL  LE, warm and well perfused.   Assessment and Plan: Viral URI, throat pain 1.  Throat pain - Likely peri laryngeal muscular strain versus mild laryngeal soft tissue contusion. Less likelly infection or PTA given no fever and benign exam. Will manage conservatively. Tylenol or motrin PRN pain  2, Cough congestion - History and physical exam benign. CXR done today at patient's request was normal - Will use OTC cold and flu remedies - return precautions discussed   Alyssa A. Kennon RoundsHaney MD, MS Family Medicine Resident PGY-3 Pager [phone]

## 2019-06-12 ENCOUNTER — Ambulatory Visit (HOSPITAL_COMMUNITY)
Admission: EM | Admit: 2019-06-12 | Discharge: 2019-06-12 | Disposition: A | Discharge status: Home / Self Care | Payer: BC Managed Care – PPO

## 2019-06-12 ENCOUNTER — Encounter (HOSPITAL_COMMUNITY): Payer: Self-pay

## 2019-06-12 ENCOUNTER — Other Ambulatory Visit: Payer: Self-pay

## 2019-06-12 DIAGNOSIS — J020 Streptococcal pharyngitis: Secondary | ICD-10-CM | POA: Diagnosis not present

## 2019-06-12 LAB — POCT RAPID STREP A: Streptococcus, Group A Screen (Direct): POSITIVE — AB

## 2019-06-12 MED ORDER — AZITHROMYCIN 250 MG PO TABS: 500.0000 mg | ORAL_TABLET | Freq: Every day | ORAL | 0 refills | Status: AC

## 2019-06-12 NOTE — ED Provider Notes (Signed)
[place]    CSN: [id number] Arrival date & time: 06/12/19  1823      History   Chief Complaint Chief Complaint  Patient presents with  . Sore Throat    HPI Brendan Lawson is a 39 y.o. male no significant past medical history presenting today for evaluation of sore throat.  Patient notes that he began to feel sore throat and under the weather on Monday.  He has had headache as well as some body aches.  His main concern is a sore throat as he has a lot of pain with swallowing.  Reports mild associated congestion.  Denies cough.  Wife and family with similar symptoms, wife tested positive for strep today.  Has been using ibuprofen.  HPI  Past Medical History:  Diagnosis Date  . Allergy     There are no problems to display for this patient.   Past Surgical History:  Procedure Laterality Date  . NASAL FRACTURE SURGERY         Home Medications    Prior to Admission medications   Medication Sig Start Date End Date Taking? Authorizing Provider  azithromycin (ZITHROMAX) 250 MG tablet Take 2 tablets (500 mg total) by mouth daily for 5 days. Take first 2 tablets together, then 1 every day until finished. 06/12/19 06/17/19  Adriel Desrosier C, PA-C  ibuprofen (ADVIL,MOTRIN) 800 MG tablet Take 1 tablet (800 mg total) by mouth every 8 (eight) hours as needed. Patient not taking: Reported on 02/17/2016 05/17/13   Wardell Honour, MD  ipratropium (ATROVENT) 0.03 % nasal spray Place 2 sprays into the nose 2 (two) times daily. Patient not taking: Reported on 02/17/2016 05/17/13   Wardell Honour, MD  Multiple Vitamin (MULTIVITAMIN WITH MINERALS) TABS tablet Take 1 tablet by mouth daily.    [provider]  triamcinolone (KENALOG) 0.025 % cream APPLY A SMALL AMOUNT TO AFFECTED AREA TWICE A DAY APPLY TO FOREARMS AND STOMACH 02/01/19   [provider]    Family History Family History  Problem Relation Age of Onset  . Cancer Mother        breast   . Cancer  Father        skin  . Cancer Maternal Grandfather        throat   . Heart disease Paternal Grandmother   . Cancer Other     Social History Social History   Tobacco Use  . Smoking status: Never Smoker  . Smokeless tobacco: Never Used  Substance Use Topics  . Alcohol use: Yes    Comment: social   . Drug use: Yes    Types: Marijuana     Allergies   Amoxicillin [amoxicillin]   Review of Systems Review of Systems  Constitutional: Positive for fatigue. Negative for activity change, appetite change, chills and fever.  HENT: Positive for congestion, rhinorrhea and sore throat. Negative for ear pain, sinus pressure and trouble swallowing.   Eyes: Negative for discharge and redness.  Respiratory: Negative for cough, chest tightness and shortness of breath.   Cardiovascular: Negative for chest pain.  Gastrointestinal: Negative for abdominal pain, diarrhea, nausea and vomiting.  Musculoskeletal: Negative for myalgias.  Skin: Negative for rash.  Neurological: Positive for headaches. Negative for dizziness and light-headedness.     Physical Exam Triage Vital Signs ED Triage Vitals  Enc Vitals Group     BP 06/12/19 1846 134/87     Pulse Rate 06/12/19 1846 87     Resp 06/12/19 1846  18     Temp 06/12/19 1846 98.7 F (37.1 C)     Temp Source 06/12/19 1846 Oral     SpO2 06/12/19 1846 97 %     Weight --      Height --      Head Circumference --      Peak Flow --      Pain Score 06/12/19 1845 0     Pain Loc --      Pain Edu? --      Excl. in GC? --    No data found.  Updated Vital Signs BP 134/87 (BP Location: Left Arm)   Pulse 87   Temp 98.7 F (37.1 C) (Oral)   Resp 18   SpO2 97%   Visual Acuity Right Eye Distance:   Left Eye Distance:   Bilateral Distance:    Right Eye Near:   Left Eye Near:    Bilateral Near:     Physical Exam Vitals and nursing note reviewed.  Constitutional:      Appearance: He is well-developed.  HENT:     Head: Normocephalic and  atraumatic.     Ears:     Comments: Bilateral ears without tenderness to palpation of external auricle, tragus and mastoid, EAC's without erythema or swelling, TM's with good bony landmarks and cone of light. Non erythematous.     Mouth/Throat:     Comments: Oral mucosa pink and moist, no tonsillar enlargement or exudate, tonsils do appear erythematous bilaterally. Posterior pharynx patent and nonerythematous, no uvula deviation or swelling. Normal phonation.  Visible postnasal drainage in posterior pharynx Eyes:     Conjunctiva/sclera: Conjunctivae normal.  Cardiovascular:     Rate and Rhythm: Normal rate and regular rhythm.     Heart sounds: No murmur.  Pulmonary:     Effort: Pulmonary effort is normal. No respiratory distress.     Breath sounds: Normal breath sounds.     Comments: Breathing comfortably at rest, CTABL, no wheezing, rales or other adventitious sounds auscultated Abdominal:     Palpations: Abdomen is soft.     Tenderness: There is no abdominal tenderness.  Musculoskeletal:     Cervical back: Neck supple.  Skin:    General: Skin is warm and dry.  Neurological:     Mental Status: He is alert.      UC Treatments / Results  Labs (all labs ordered are listed, but only abnormal results are displayed) Labs Reviewed  POCT RAPID STREP A - Abnormal; Notable for the following components:      Result Value   Streptococcus, Group A Screen (Direct) POSITIVE (*)    All other components within normal limits    EKG   Radiology No results found.  Procedures Procedures (including critical care time)  Medications Ordered in UC Medications - No data to display  Initial Impression / Assessment and Plan / UC Course  I have reviewed the triage vital signs and the nursing notes.  Pertinent labs & imaging results that were available during my care of the patient were reviewed by me and considered in my medical decision making (see chart for details).     Strep test  positive, treated with azithromycin 500 mg daily for 5 days.  Discussed further symptomatic and supportive care as well.  Rest and push fluids.  Discussed strict return precautions. Patient verbalized understanding and is agreeable with plan.  Final Clinical Impressions(s) / UC Diagnoses   Final diagnoses:  Strep throat  Discharge Instructions     Sore Throat  Your rapid strep test was positive today. We will treat you for strep throat with an antibiotic. Please take Azithromycin as prescribed.   Please continue Tylenol or Ibuprofen for fever and pain. May try salt water gargles, cepacol lozenges, throat spray, or OTC cold relief medicine for throat discomfort. If you also have congestion take a daily anti-histamine like Zyrtec, Claritin, and a oral decongestant to help with post nasal drip that may be irritating your throat.   Stay hydrated and drink plenty of fluids to keep your throat coated relieve irritation.      ED Prescriptions    Medication Sig Dispense Auth. Provider   azithromycin (ZITHROMAX) 250 MG tablet Take 2 tablets (500 mg total) by mouth daily for 5 days. Take first 2 tablets together, then 1 every day until finished. 10 tablet Bentley Haralson, Hawi C, PA-C     PDMP not reviewed this encounter.   Lew Dawes, [place] 06/12/19 1926

## 2019-06-12 NOTE — Discharge Instructions (Signed)
Sore Throat  Your rapid strep test was positive today. We will treat you for strep throat with an antibiotic. Please take Azithromycin as prescribed.   Please continue Tylenol or Ibuprofen for fever and pain. May try salt water gargles, cepacol lozenges, throat spray, or OTC cold relief medicine for throat discomfort. If you also have congestion take a daily anti-histamine like Zyrtec, Claritin, and a oral decongestant to help with post nasal drip that may be irritating your throat.   Stay hydrated and drink plenty of fluids to keep your throat coated relieve irritation.

## 2019-06-12 NOTE — ED Triage Notes (Addendum)
Pt is here with a sore throat that started Sunday, states he has taken some meds of his wife(Azithromycin-2 250MG  tabs 2 hours ago), is requesting the same meds. States he does not want COVID testing & was just COVID vaccinated 06/07/2019.

## 2019-07-01 ENCOUNTER — Ambulatory Visit
Admission: EM | Admit: 2019-07-01 | Discharge: 2019-07-01 | Disposition: A | Discharge status: Home / Self Care | Payer: BC Managed Care – PPO

## 2019-07-01 DIAGNOSIS — R591 Generalized enlarged lymph nodes: Secondary | ICD-10-CM | POA: Diagnosis not present

## 2019-07-01 MED ORDER — AZELASTINE HCL 0.1 % NA SOLN: 2.0000 | Freq: Two times a day (BID) | NASAL | 0 refills | Status: DC

## 2019-07-01 MED ORDER — FLUTICASONE PROPIONATE 50 MCG/ACT NA SUSP: 2.0000 | Freq: Every day | NASAL | 0 refills | Status: DC

## 2019-07-01 NOTE — ED Provider Notes (Signed)
[facility]    CSN: [id number] Arrival date & time: 07/01/19  0914      History   Chief Complaint Chief Complaint  Patient presents with  . swollen glands to neck    HPI Brendan Lawson is a 39 y.o. male.   39 year old male comes in for right-sided neck pain from what he thinks is swollen lymph nodes starting 5 days ago.  He was treated for strep 2 weeks ago, states the symptoms resolved after taking antibiotics.  He had some congestion/postnasal drip 5 to 6 days ago that has significantly improved as well.  Denies current URI symptoms, fevers, dental pain, swelling of the throat.  He took ibuprofen 600 mg with some relief.     Past Medical History:  Diagnosis Date  . Allergy     There are no problems to display for this patient.   Past Surgical History:  Procedure Laterality Date  . NASAL FRACTURE SURGERY         Home Medications    Prior to Admission medications   Medication Sig Start Date End Date Taking? Authorizing Provider  esomeprazole (NEXIUM) 20 MG capsule Take 20 mg by mouth daily at 12 noon.   Yes [provider]  azelastine (ASTELIN) 0.1 % nasal spray Place 2 sprays into both nostrils 2 (two) times daily. 07/01/19   Cathie Hoops,  V, PA-C  fluticasone (FLONASE) 50 MCG/ACT nasal spray Place 2 sprays into both nostrils daily. 07/01/19   Cathie Hoops,  V, PA-C  ibuprofen (ADVIL,MOTRIN) 800 MG tablet Take 1 tablet (800 mg total) by mouth every 8 (eight) hours as needed. Patient not taking: Reported on 02/17/2016 05/17/13   Ethelda Chick, MD  Multiple Vitamin (MULTIVITAMIN WITH MINERALS) TABS tablet Take 1 tablet by mouth daily.    [provider]    Family History Family History  Problem Relation Age of Onset  . Cancer Mother        breast   . Cancer Father        skin  . Cancer Maternal Grandfather        throat   . Heart disease Paternal Grandmother   . Cancer Other     Social History Social History   Tobacco Use  . Smoking  status: Never Smoker  . Smokeless tobacco: Never Used  Substance Use Topics  . Alcohol use: Yes    Comment: social   . Drug use: Yes    Types: Marijuana     Allergies   Amoxicillin [amoxicillin]   Review of Systems Review of Systems  Reason unable to perform ROS: See HPI as above.     Physical Exam Triage Vital Signs ED Triage Vitals  Enc Vitals Group     BP 07/01/19 0925 137/89     Pulse Rate 07/01/19 0925 76     Resp 07/01/19 0925 16     Temp 07/01/19 0925 97.9 F (36.6 C)     Temp Source 07/01/19 0925 Oral     SpO2 07/01/19 0925 96 %     Weight --      Height --      Head Circumference --      Peak Flow --      Pain Score 07/01/19 0947 2     Pain Loc --      Pain Edu? --      Excl. in [place]? --    No data found.  Updated Vital Signs BP 137/89 (BP Location: Right  Arm)   Pulse 76   Temp 97.9 F (36.6 C) (Oral)   Resp 16   SpO2 96%   Physical Exam Constitutional:      General: He is not in acute distress.    Appearance: Normal appearance. He is not ill-appearing, toxic-appearing or diaphoretic.  HENT:     Head: Normocephalic and atraumatic.     Right Ear: Tympanic membrane, ear canal and external ear normal. Tympanic membrane is not erythematous or bulging.     Left Ear: Tympanic membrane, ear canal and external ear normal. Tympanic membrane is not erythematous or bulging.     Mouth/Throat:     Mouth: Mucous membranes are moist.     Pharynx: Oropharynx is clear. Uvula midline.  Cardiovascular:     Rate and Rhythm: Normal rate and regular rhythm.     Heart sounds: Normal heart sounds. No murmur. No friction rub. No gallop.   Pulmonary:     Effort: Pulmonary effort is normal. No accessory muscle usage, prolonged expiration, respiratory distress or retractions.     Comments: Lungs clear to auscultation without adventitious lung sounds. Musculoskeletal:     Cervical back: Normal range of motion and neck supple. No spinous process tenderness or muscular  tenderness.  Lymphadenopathy:     Cervical: Cervical adenopathy present.     Right cervical: Superficial cervical adenopathy present.     Left cervical: Superficial cervical adenopathy present.  Neurological:     General: No focal deficit present.     Mental Status: He is alert and oriented to person, place, and time.     UC Treatments / Results  Labs (all labs ordered are listed, but only abnormal results are displayed) Labs Reviewed - No data to display  EKG   Radiology No results found.  Procedures Procedures (including critical care time)  Medications Ordered in UC Medications - No data to display  Initial Impression / Assessment and Plan / UC Course  I have reviewed the triage vital signs and the nursing notes.  Pertinent labs & imaging results that were available during my care of the patient were reviewed by me and considered in my medical decision making (see chart for details).    Bilateral cervical lymphadenopathy with tenderness to palpation.  No erythema or warmth, low suspicion for lymphadenitis.  Patient recently diagnosed with strep throat.  Discussed to continue to monitor lymphadenopathy, continue NSAIDs to help with symptoms.  Return precautions given.  Patient expresses understanding and agrees to plan.  Final Clinical Impressions(s) / UC Diagnoses   Final diagnoses:  Lymphadenopathy of head and neck   ED Prescriptions    Medication Sig Dispense Auth. Provider   fluticasone (FLONASE) 50 MCG/ACT nasal spray Place 2 sprays into both nostrils daily. 1 g ,  V, PA-C   azelastine (ASTELIN) 0.1 % nasal spray Place 2 sprays into both nostrils 2 (two) times daily. 30 mL Ok Edwards, PA-C     PDMP not reviewed this encounter.   Ok Edwards, PA-C 07/01/19 1019

## 2019-07-01 NOTE — ED Triage Notes (Signed)
Pt c/o pain and swelling lymph nodes to rt side of neck with pressure to rt ear and rt lower teeth. States had strep throat 2wks ago.

## 2019-07-01 NOTE — Discharge Instructions (Signed)
Continue ibuprofen 600-800mg  three times a day to help with pain. This is most likely due to having infection 1-2 weeks ago. This should resolve on own in 1-2 months. If needed for allergies, can start flonase/azelastine for the symptoms. Follow up with PCP for further evaluation if symptoms not improving.

## 2019-08-02 ENCOUNTER — Encounter: Payer: Self-pay | Admitting: Emergency Medicine

## 2019-08-02 ENCOUNTER — Ambulatory Visit
Admission: EM | Admit: 2019-08-02 | Discharge: 2019-08-02 | Disposition: A | Discharge status: Home / Self Care | Payer: BC Managed Care – PPO | Attending: Physician Assistant | Admitting: Physician Assistant

## 2019-08-02 ENCOUNTER — Other Ambulatory Visit: Payer: Self-pay

## 2019-08-02 DIAGNOSIS — J029 Acute pharyngitis, unspecified: Secondary | ICD-10-CM | POA: Insufficient documentation

## 2019-08-02 NOTE — Discharge Instructions (Signed)
Rapid strep negative. COVID PCR testing ordered. I would like you to quarantine until testing results. You can take over the counter flonase/nasacort to help with nasal congestion/drainage. Tylenol/motrin for pain and fever. Keep hydrated, urine should be clear to pale yellow in color. If experiencing shortness of breath, trouble breathing, go to the emergency department for further evaluation needed.  

## 2019-08-02 NOTE — ED Provider Notes (Signed)
[facility]    CSN: [id number] Arrival date & time: 08/02/19  1515      History   Chief Complaint Chief Complaint  Patient presents with  . Sore Throat    HPI Mali D Muse is a 39 y.o. male.   39 year old male comes in for 3 day of URI symptoms. Has had sore throat, mild rhinorrhea. Denies cough. Denies fever, chills, body aches. Denies abdominal pain, nausea, vomiting, diarrhea. Denies shortness of breath, loss of taste/smell. COVID vaccine completed 06/2019     Past Medical History:  Diagnosis Date  . Allergy     There are no problems to display for this patient.   Past Surgical History:  Procedure Laterality Date  . NASAL FRACTURE SURGERY         Home Medications    Prior to Admission medications   Medication Sig Start Date End Date Taking? Authorizing Provider  azelastine (ASTELIN) 0.1 % nasal spray Place 2 sprays into both nostrils 2 (two) times daily. 07/01/19   Tasia Catchings, Elisandra Deshmukh V, PA-C  esomeprazole (NEXIUM) 20 MG capsule Take 20 mg by mouth daily at 12 noon.    [provider]  fluticasone (FLONASE) 50 MCG/ACT nasal spray Place 2 sprays into both nostrils daily. 07/01/19   Tasia Catchings, Madolyn Ackroyd V, PA-C  ibuprofen (ADVIL,MOTRIN) 800 MG tablet Take 1 tablet (800 mg total) by mouth every 8 (eight) hours as needed. Patient not taking: Reported on 02/17/2016 05/17/13   Wardell Honour, MD  Multiple Vitamin (MULTIVITAMIN WITH MINERALS) TABS tablet Take 1 tablet by mouth daily.    [provider]    Family History Family History  Problem Relation Age of Onset  . Cancer Mother        breast   . Cancer Father        skin  . Cancer Maternal Grandfather        throat   . Heart disease Paternal Grandmother   . Cancer Other     Social History Social History   Tobacco Use  . Smoking status: Never Smoker  . Smokeless tobacco: Never Used  Substance Use Topics  . Alcohol use: Yes    Comment: social   . Drug use: Yes    Types: Marijuana      Allergies   Amoxicillin [amoxicillin]   Review of Systems Review of Systems  Reason unable to perform ROS: See HPI as above.     Physical Exam Triage Vital Signs ED Triage Vitals  Enc Vitals Group     BP 08/02/19 1522 (!) 153/84     Pulse Rate 08/02/19 1522 67     Resp 08/02/19 1522 16     Temp 08/02/19 1522 98.2 F (36.8 C)     Temp Source 08/02/19 1522 Oral     SpO2 08/02/19 1522 96 %     Weight --      Height --      Head Circumference --      Peak Flow --      Pain Score 08/02/19 1525 6     Pain Loc --      Pain Edu? --      Excl. in [employer]? --    No data found.  Updated Vital Signs BP (!) 153/84 (BP Location: Left Arm)   Pulse 67   Temp 98.2 F (36.8 C) (Oral)   Resp 16   SpO2 96%     Physical Exam Constitutional:  General: He is not in acute distress.    Appearance: Normal appearance. He is not ill-appearing, toxic-appearing or diaphoretic.  HENT:     Head: Normocephalic and atraumatic.     Mouth/Throat:     Mouth: Mucous membranes are moist.     Pharynx: Oropharynx is clear. Uvula midline. No posterior oropharyngeal erythema.  Cardiovascular:     Rate and Rhythm: Normal rate and regular rhythm.     Heart sounds: Normal heart sounds. No murmur. No friction rub. No gallop.   Pulmonary:     Effort: Pulmonary effort is normal. No accessory muscle usage, prolonged expiration, respiratory distress or retractions.     Comments: Lungs clear to auscultation without adventitious lung sounds. Musculoskeletal:     Cervical back: Normal range of motion and neck supple.  Neurological:     General: No focal deficit present.     Mental Status: He is alert and oriented to person, place, and time.      UC Treatments / Results  Labs (all labs ordered are listed, but only abnormal results are displayed) Labs Reviewed  CULTURE, GROUP A STREP ([facility])  NOVEL CORONAVIRUS, NAA  POCT RAPID STREP A (OFFICE)    EKG   Radiology No results  found.  Procedures Procedures (including critical care time)  Medications Ordered in UC Medications - No data to display  Initial Impression / Assessment and Plan / UC Course  I have reviewed the triage vital signs and the nursing notes.  Pertinent labs & imaging results that were available during my care of the patient were reviewed by me and considered in my medical decision making (see chart for details).    Rapid strep negative. COVID PCR test ordered. Patient to quarantine until testing results return. No alarming signs on exam.  Patient speaking in full sentences without respiratory distress.  Symptomatic treatment discussed.  Push fluids.  Return precautions given.  Patient expresses understanding and agrees to plan.  Final Clinical Impressions(s) / UC Diagnoses   Final diagnoses:  Sore throat    ED Prescriptions    None     PDMP not reviewed this encounter.   Belinda Fisher, PA-C 08/02/19 1546

## 2019-08-02 NOTE — ED Triage Notes (Addendum)
Pt here for sore throat x 3 days; denies fever; pt sts runny nose also; pt sts has had both covid vaccines last given 4/20

## 2019-08-03 LAB — NOVEL CORONAVIRUS, NAA: SARS-CoV-2, NAA: NOT DETECTED

## 2019-08-03 LAB — SARS-COV-2, NAA 2 DAY TAT

## 2019-08-04 ENCOUNTER — Other Ambulatory Visit: Payer: Self-pay

## 2019-08-04 ENCOUNTER — Ambulatory Visit (HOSPITAL_COMMUNITY)
Admission: EM | Admit: 2019-08-04 | Discharge: 2019-08-04 | Disposition: A | Discharge status: Home / Self Care | Payer: BC Managed Care – PPO | Attending: Family Medicine | Admitting: Family Medicine

## 2019-08-04 ENCOUNTER — Encounter (HOSPITAL_COMMUNITY): Payer: Self-pay

## 2019-08-04 DIAGNOSIS — J029 Acute pharyngitis, unspecified: Secondary | ICD-10-CM | POA: Diagnosis not present

## 2019-08-04 LAB — POCT INFECTIOUS MONO SCREEN: Mono Screen: NEGATIVE

## 2019-08-04 NOTE — ED Triage Notes (Signed)
Pt present sore throat, symptom started 3 -7 days ago. Pt stated that he went to an urgent care on Friday and his test results came back negative for covid and strep. But his symptoms has progressively gotten worst. Pt is having difficulty swallowing.

## 2019-08-04 NOTE — ED Provider Notes (Signed)
[facility]   [id number] 08/04/19 Arrival Time: 1205  [id number] THROAT  SUBJECTIVE: History from: patient.  Brendan Lawson is a 39 y.o. male who presents with abrupt onset of sore throat for 1 week.  Denies sick exposure to Covid, strep, flu or mono, or precipitating event. He was tested for strep and Covid x 2 days ago and both were negative. Was seen at [facility] at [facility].  Has made no attempts to treat at home.  Symptoms are made worse with swallowing, but tolerating liquids and own secretions without difficulty. Denies previous symptoms in the past. Reports that he does have issues with ulcers in his mouth when he eats acidic foods. Takes nexium for this with relief. No other significant medical history.  Denies fever, chills, fatigue, ear pain, sinus pain, rhinorrhea, nasal congestion, cough, SOB, wheezing, chest pain, nausea, rash, changes in bowel or bladder habits.     ROS: As per HPI.  All other pertinent ROS negative.     Past Medical History:  Diagnosis Date  . Allergy    Past Surgical History:  Procedure Laterality Date  . NASAL FRACTURE SURGERY     Allergies  Allergen Reactions  . Amoxicillin [Amoxicillin] Hives   No current facility-administered medications on file prior to encounter.   Current Outpatient Medications on File Prior to Encounter  Medication Sig Dispense Refill  . azelastine (ASTELIN) 0.1 % nasal spray Place 2 sprays into both nostrils 2 (two) times daily. 30 mL 0  . esomeprazole (NEXIUM) 20 MG capsule Take 20 mg by mouth daily at 12 noon.    . fluticasone (FLONASE) 50 MCG/ACT nasal spray Place 2 sprays into both nostrils daily. 1 g 0  . ibuprofen (ADVIL,MOTRIN) 800 MG tablet Take 1 tablet (800 mg total) by mouth every 8 (eight) hours as needed. (Patient not taking: Reported on 02/17/2016) 30 tablet 0  . Multiple Vitamin (MULTIVITAMIN WITH MINERALS) TABS tablet Take 1 tablet by mouth daily.     Social History   Socioeconomic History  .  Marital status: Married    Spouse name: Not on file  . Number of children: Not on file  . Years of education: Not on file  . Highest education level: Not on file  Occupational History  . Not on file  Tobacco Use  . Smoking status: Never Smoker  . Smokeless tobacco: Never Used  Substance and Sexual Activity  . Alcohol use: Yes    Comment: social   . Drug use: Yes    Types: Marijuana  . Sexual activity: Yes    Birth control/protection: None    Comment: Married  Other Topics Concern  . Not on file  Social History Narrative   Marital status: married      Children: none      Employment:  Manufacturing engineer; also works in Clinical cytogeneticist.      Tobacco: none      Alcohol:  Socially; weekends      Drugs: none   Social Determinants of Health   Financial Resource Strain:   . Difficulty of Paying Living Expenses:   Food Insecurity:   . Worried About Programme researcher, broadcasting/film/video in the Last Year:   . Barista in the Last Year:   Transportation Needs:   . Freight forwarder (Medical):   Marland Kitchen Lack of Transportation (Non-Medical):   Physical Activity:   . Days of Exercise per Week:   . Minutes of Exercise per Session:  Stress:   . Feeling of Stress :   Social Connections:   . Frequency of Communication with Friends and Family:   . Frequency of Social Gatherings with Friends and Family:   . Attends Religious Services:   . Active Member of Clubs or Organizations:   . Attends Archivist Meetings:   Marland Kitchen Marital Status:   Intimate Partner Violence:   . Fear of Current or Ex-Partner:   . Emotionally Abused:   Marland Kitchen Physically Abused:   . Sexually Abused:    Family History  Problem Relation Age of Onset  . Cancer Mother        breast   . Cancer Father        skin  . Cancer Maternal Grandfather        throat   . Heart disease Paternal Grandmother   . Cancer Other     OBJECTIVE:  Vitals:   08/04/19 1301  BP: 140/87  Pulse: 78  Resp: 18  Temp: 98.4 F (36.9 C)    TempSrc: Oral  SpO2: 100%     General appearance: alert; appears fatigued, but nontoxic, speaking in full sentences and managing own secretions HEENT: NCAT; Ears: EACs clear, TMs pearly gray with visible cone of light, without erythema; Eyes: PERRL, EOMI grossly; Nose: no obvious rhinorrhea; Throat: oropharynx ulcerated, tonsils 1+ and mildly erythematous without white tonsillar exudates, uvula midline Neck: supple without LAD Lungs: CTA bilaterally without adventitious breath sounds; cough absent Heart: regular rate and rhythm.  Radial pulses 2+ symmetrical bilaterally Skin: warm and dry Psychological: alert and cooperative; normal mood and affect  LABS: No results found for this or any previous visit (from the past 24 hour(s)).   ASSESSMENT & PLAN:  1. Pharyngitis, unspecified etiology     No orders of the defined types were placed in this encounter.    Mono test  Take dose of liquid benadryl and take dose of Maalox immediately after, try this regimen for the next 2 days. Get plenty of rest and push fluids Drink warm or cool liquids, use throat lozenges, or popsicles to help alleviate symptoms Take OTC ibuprofen or tylenol as needed for pain Follow up with PCP if symptoms persists Return or go to ER if patient has any new or worsening symptoms such as fever, chills, nausea, vomiting, worsening sore throat, cough, abdominal pain, chest pain, changes in bowel or bladder habits.  Reviewed expectations re: course of current medical issues. Questions answered. Outlined signs and symptoms indicating need for more acute intervention. Patient verbalized understanding. After Visit Summary given.          Faustino Congress, NP 08/04/19 1418

## 2019-08-04 NOTE — Discharge Instructions (Signed)
I would like you to try a dose of liquid benadryl followed by a dose of Maalox.  This will help the inflammation in your throat as well as coat it to help protect the integrity of your esophagus.  Follow up with primary care or with this office as needed.

## 2019-08-07 LAB — CULTURE, GROUP A STREP (THRC)

## 2020-06-18 ENCOUNTER — Other Ambulatory Visit: Payer: Self-pay

## 2020-06-18 ENCOUNTER — Ambulatory Visit
Admission: EM | Admit: 2020-06-18 | Discharge: 2020-06-18 | Disposition: A | Discharge status: Home / Self Care | Payer: BC Managed Care – PPO | Attending: Family Medicine | Admitting: Family Medicine

## 2020-06-18 ENCOUNTER — Ambulatory Visit (INDEPENDENT_AMBULATORY_CARE_PROVIDER_SITE_OTHER): Payer: BC Managed Care – PPO

## 2020-06-18 DIAGNOSIS — M25551 Pain in right hip: Secondary | ICD-10-CM

## 2020-06-18 DIAGNOSIS — M25561 Pain in right knee: Secondary | ICD-10-CM | POA: Diagnosis not present

## 2020-06-18 DIAGNOSIS — W19XXXA Unspecified fall, initial encounter: Secondary | ICD-10-CM | POA: Diagnosis not present

## 2020-06-18 DIAGNOSIS — M7918 Myalgia, other site: Secondary | ICD-10-CM

## 2020-06-18 MED ORDER — PREDNISONE 20 MG PO TABS: 40.0000 mg | ORAL_TABLET | Freq: Every day | ORAL | 0 refills | Status: DC

## 2020-06-18 NOTE — ED Provider Notes (Signed)
  [facility]   [id number] 06/18/20 Arrival Time: 1606  ASSESSMENT & PLAN:  1. Right knee pain, unspecified chronicity   2. Right buttock pain     See my previous note today. Return for x-rays. Needed to clarify with insurance re: coverage. Desires imaging at this time.   I have personally viewed the imaging studies ordered this visit. No fractures appreciated. Mild degen changes. See radiology report. Discussed with pt. Proceed with plan in prev note.  Orders Placed This Encounter  Procedures  . DG Knee Complete 4 Views Right  . DG Hip Unilat W or Wo Pelvis 1 View Right         Mardella Layman, MD 06/18/20 [phone]

## 2020-06-18 NOTE — ED Triage Notes (Signed)
Pt c/o pain rt buttocks and behind rt knee off and on for past 3 months. States pain is mainly while standing. States slipped getting in tub 3 months ago.

## 2020-06-18 NOTE — ED Provider Notes (Signed)
[facility]   [id number] 06/18/20 Arrival Time: 1452  ASSESSMENT & PLAN:  1. Right knee pain, unspecified chronicity   2. Right buttock pain     Able to ambulate here and hemodynamically stable.  Begin trial of: Meds ordered this encounter  Medications  . predniSONE (DELTASONE) 20 MG tablet    Sig: Take 2 tablets (40 mg total) by mouth daily.    Dispense:  10 tablet    Refill:  0   Encourage ROM/movement as tolerated.  Recommend:  Follow-up Information    Schedule an appointment as soon as possible for a visit  with [facility].   Contact information: [address] [place] [address] [phone]              Reviewed expectations re: course of current medical issues. Questions answered. Outlined signs and symptoms indicating need for more acute intervention. Patient verbalized understanding. After Visit Summary given.   SUBJECTIVE: History from: patient.  Brendan Lawson is a 40 y.o. male who reports on/off R buttock and post R knee discomfort; no injury/trauma; worse with prolonged standing; did slip in tub 3 m ago; questions relation. No extremity sensation changes or weakness. Normal bowel/bladder habits. No OTC anti-inflammatories. Does not wake from sleep. No h/o similar. Works at [employer]; stands all shift.   OBJECTIVE:  Vitals:   06/18/20 1501  BP: (!) 148/74  Pulse: 64  Resp: 20  Temp: 97.9 F (36.6 C)  TempSrc: Oral  SpO2: 98%    General appearance: alert; no distress HEENT: [place]; AT Neck: supple with FROM; without midline tenderness CV: regular Lungs: unlabored respirations; speaks full sentences without difficulty Abdomen: soft, non-tender; non-distended Back: no specific tenderness to palpation; FROM at waist; no midline tenderness Extremities: without edema; symmetrical without gross deformities; normal ROM of bilateral LE; no specific pain over post knee Skin: warm and  dry Neurologic: normal gait; normal sensation and strength of bilateral LE Psychological: alert and cooperative; normal mood and affect   Allergies  Allergen Reactions  . Amoxicillin [Amoxicillin] Hives    Past Medical History:  Diagnosis Date  . Allergy    Social History   Socioeconomic History  . Marital status: Married    Spouse name: Not on file  . Number of children: Not on file  . Years of education: Not on file  . Highest education level: Not on file  Occupational History  . Not on file  Tobacco Use  . Smoking status: Never Smoker  . Smokeless tobacco: Never Used  Substance and Sexual Activity  . Alcohol use: Yes    Comment: social   . Drug use: Not Currently    Types: Marijuana  . Sexual activity: Yes    Birth control/protection: None    Comment: Married  Other Topics Concern  . Not on file  Social History Narrative   Marital status: married      Children: none      Employment:  Manufacturing engineer; also works in Clinical cytogeneticist.      Tobacco: none      Alcohol:  Socially; weekends      Drugs: none   Social Determinants of Health   Financial Resource Strain: Not on file  Food Insecurity: Not on file  Transportation Needs: Not on file  Physical Activity: Not on file  Stress: Not on file  Social Connections: Not on file  Intimate Partner Violence: Not on file   Family History  Problem Relation Age of Onset  . Cancer Mother        breast   . Cancer Father        skin  . Cancer Maternal Grandfather        throat   . Heart disease Paternal Grandmother   . Cancer Other    Past Surgical History:  Procedure Laterality Date  . NASAL FRACTURE SURGERY       Mardella Layman, MD 06/18/20 [phone]

## 2020-06-18 NOTE — ED Triage Notes (Signed)
Pt returned for leg xray

## 2020-07-01 ENCOUNTER — Encounter: Payer: Self-pay | Admitting: Nurse Practitioner

## 2020-07-01 ENCOUNTER — Ambulatory Visit: Payer: BC Managed Care – PPO | Admitting: Nurse Practitioner

## 2020-07-01 ENCOUNTER — Other Ambulatory Visit: Payer: Self-pay

## 2020-07-01 VITALS — BP 118/74 | HR 65 | Temp 99.8°F | Ht 69.0 in | Wt 202.4 lb

## 2020-07-01 DIAGNOSIS — Z7689 Persons encountering health services in other specified circumstances: Secondary | ICD-10-CM | POA: Diagnosis not present

## 2020-07-01 DIAGNOSIS — G959 Disease of spinal cord, unspecified: Secondary | ICD-10-CM | POA: Insufficient documentation

## 2020-07-01 DIAGNOSIS — M25571 Pain in right ankle and joints of right foot: Secondary | ICD-10-CM | POA: Diagnosis not present

## 2020-07-01 NOTE — Progress Notes (Signed)
New Patient Office Visit  Subjective:  Patient ID: Brendan D Doro, male    DOB: [birth year]  Age: 40 y.o. MRN: [id number]  CC:  Chief Complaint  Patient presents with  . New Patient (Initial Visit)    HPI Brendan Lawson presents to establish new primary care provider. He states that he does not remember the last time he had a "regular" provider. He states that, even as a kid, he did not have long-term, established care. He moved around every 2 years or so, as his dad was in the army. Today, he c/o sciatic nerve pain. States he does not really have pain in lower back. Pain starts in the area of right buttock and radiates down the right leg. Pain will often settle behind the right knee. This has been going on for about 3 months. States that sitting is fine. Standing in one position and leaning toward the right increases the pain. Activities requiring repetitive motion increase the pain. He states that he did go to  Urgent Care on 06/18/2020 after he fell, landing on right knee. They did x-ray of the right knee and right hip. The x-ray of the right hip revealed that there is minimal degenerative disc disease of the lumbar spine. He has not had further imaging for this.  Due to have general physical. Should have baseline fasting labs done.   Past Medical History:  Diagnosis Date  . Allergy   . GERD (gastroesophageal reflux disease)    [facility] 06/30/2020    Past Surgical History:  Procedure Laterality Date  . NASAL FRACTURE SURGERY    . VASECTOMY N/A    [facility] 06/30/2020    Family History  Problem Relation Age of Onset  . Cancer Mother        breast   . Cancer Father        skin  . Cancer Maternal Grandfather        throat   . Heart disease Paternal Grandmother   . Cancer Other     Social History   Socioeconomic History  . Marital status: Married    Spouse name: Not on file  . Number of children: Not on file  . Years of education: Not on file  . Highest education level: Not  on file  Occupational History  . Not on file  Tobacco Use  . Smoking status: Never Smoker  . Smokeless tobacco: Never Used  Substance and Sexual Activity  . Alcohol use: Yes    Comment: social   . Drug use: Never    Types: Marijuana  . Sexual activity: Yes    Partners: Female    Birth control/protection: None    Comment: Married  Other Topics Concern  . Not on file  Social History Narrative   Marital status: married      Children: none      Employment:  Manufacturing engineer; also works in Clinical cytogeneticist.      Tobacco: none      Alcohol:  Socially; weekends      Drugs: none   Social Determinants of Health   Financial Resource Strain: Not on file  Food Insecurity: Not on file  Transportation Needs: Not on file  Physical Activity: Not on file  Stress: Not on file  Social Connections: Not on file  Intimate Partner Violence: Not on file    ROS Review of Systems  Constitutional: Negative for activity change, chills and fever.  HENT: Negative for congestion, postnasal drip, rhinorrhea,  sinus pressure, sinus pain and sneezing.   Eyes: Negative.   Respiratory: Negative for cough, shortness of breath and wheezing.   Cardiovascular: Negative for chest pain and palpitations.  Gastrointestinal: Negative for constipation, diarrhea, nausea and vomiting.  Endocrine: Negative.   Genitourinary: Negative.   Musculoskeletal: Positive for arthralgias and myalgias. Negative for back pain.       Pain which starts in right but cheek and radiates into the back of the right knee. Worse with repetitive motion and standing. Better when sitting down and stretching.  Pain in right big toe. States he hit it hard, going up the stairs. Has caused him pain in the big joint of big toe.   Skin: Negative for rash.  Allergic/Immunologic: Negative for environmental allergies.  Neurological: Negative for dizziness, weakness and headaches.  Hematological: Negative for adenopathy.  Psychiatric/Behavioral: The  patient is not nervous/anxious.   All other systems reviewed and are negative.   Objective:   Today's Vitals   07/01/20 1108  BP: 118/74  Pulse: 65  Temp: 99.8 F (37.7 C)  SpO2: 96%  Weight: 202 lb 6.4 oz (91.8 kg)  Height: 5\' 9"  (1.753 m)   Body mass index is 29.89 kg/m.   Physical Exam Vitals and nursing note reviewed.  Constitutional:      Appearance: Normal appearance. He is well-developed.  HENT:     Head: Normocephalic and atraumatic.  Eyes:     Extraocular Movements: Extraocular movements intact.     Conjunctiva/sclera: Conjunctivae normal.     Pupils: Pupils are equal, round, and reactive to light.  Cardiovascular:     Rate and Rhythm: Normal rate and regular rhythm.     Pulses: Normal pulses.     Heart sounds: Normal heart sounds.  Pulmonary:     Effort: Pulmonary effort is normal.     Breath sounds: Normal breath sounds.  Abdominal:     Palpations: Abdomen is soft.  Musculoskeletal:        General: Normal range of motion.     Cervical back: Normal range of motion and neck supple.  Skin:    General: Skin is warm and dry.     Capillary Refill: Capillary refill takes less than 2 seconds.  Neurological:     General: No focal deficit present.     Mental Status: He is alert and oriented to person, place, and time.  Psychiatric:        Mood and Affect: Mood normal.        Behavior: Behavior normal.        Thought Content: Thought content normal.        Judgment: Judgment normal.     Assessment & Plan:  1. Encounter to establish care Appointment today to establish new primary care provider.   2. Disease of spinal cord (HCC) X-ray of right hip showing minimal degenerative disc disease. Will get MRI for further evaluation. Refer to orthopedics as indicated.  - MR Lumbar Spine Wo Contrast; Future  3. Pain in joint of right foot Pain in proximal joint of the right great toe. Hurts with increased weight on right foot. Gradually improving. Encouraged him  to ice and rest the foot when possible. If it worsens or persists, will get x-ray.   Problem List Items Addressed This Visit      Nervous and Auditory   Disease of spinal cord (HCC)   Relevant Orders   MR Lumbar Spine Wo Contrast     Other   Encounter to  establish care - Primary   Pain in joint of right foot      Outpatient Encounter Medications as of 07/01/2020  Medication Sig  . esomeprazole (NEXIUM) 20 MG capsule Take 20 mg by mouth daily at 12 noon.  . Multiple Vitamin (MULTIVITAMIN WITH MINERALS) TABS tablet Take 1 tablet by mouth daily.  . predniSONE (DELTASONE) 20 MG tablet Take 2 tablets (40 mg total) by mouth daily.   No facility-administered encounter medications on file as of 07/01/2020.   Time spent with the patient was approximately 30 minutes. This time included reviewing progress notes, labs, imaging studies, and discussing plan for follow up.   Follow-up: Return in about 4 weeks (around 07/29/2020) for general physical - fbw about a week before. Marland Kitchen   Carlean Jews, NP

## 2020-07-01 NOTE — Patient Instructions (Signed)
Sciatica  Sciatica is pain, weakness, tingling, or loss of feeling (numbness) along the sciatic nerve. The sciatic nerve starts in the lower back and goes down the back of each leg. Sciatica usually goes away on its own or with treatment. Sometimes, sciatica may come back (recur). What are the causes? This condition happens when the sciatic nerve is pinched or has pressure put on it. This may be the result of:  A disk in between the bones of the spine bulging out too far (herniated disk).  Changes in the spinal disks that occur with aging.  A condition that affects a muscle in the butt.  Extra bone growth near the sciatic nerve.  A break (fracture) of the area between your hip bones (pelvis).  Pregnancy.  Tumor. This is rare. What increases the risk? You are more likely to develop this condition if you:  Play sports that put pressure or stress on the spine.  Have poor strength and ease of movement (flexibility).  Have had a back injury in the past.  Have had back surgery.  Sit for long periods of time.  Do activities that involve bending or lifting over and over again.  Are very overweight (obese). What are the signs or symptoms? Symptoms can vary from mild to very bad. They may include:  Any of these problems in the lower back, leg, hip, or butt: ? Mild tingling, loss of feeling, or dull aches. ? Burning sensations. ? Sharp pains.  Loss of feeling in the back of the calf or the sole of the foot.  Leg weakness.  Very bad back pain that makes it hard to move. These symptoms may get worse when you cough, sneeze, or laugh. They may also get worse when you sit or stand for long periods of time. How is this treated? This condition often gets better without any treatment. However, treatment may include:  Changing or cutting back on physical activity when you have pain.  Doing exercises and stretching.  Putting ice or heat on the affected area.  Medicines that  help: ? To relieve pain and swelling. ? To relax your muscles.  Shots (injections) of medicines that help to relieve pain, irritation, and swelling.  Surgery. Follow these instructions at home: Medicines  Take over-the-counter and prescription medicines only as told by your doctor.  Ask your doctor if the medicine prescribed to you: ? Requires you to avoid driving or using heavy machinery. ? Can cause trouble pooping (constipation). You may need to take these steps to prevent or treat trouble pooping:  Drink enough fluids to keep your pee (urine) pale yellow.  Take over-the-counter or prescription medicines.  Eat foods that are high in fiber. These include beans, whole grains, and fresh fruits and vegetables.  Limit foods that are high in fat and sugar. These include fried or sweet foods. Managing pain  If told, put ice on the affected area. ? Put ice in a plastic bag. ? Place a towel between your skin and the bag. ? Leave the ice on for 20 minutes, 2-3 times a day.  If told, put heat on the affected area. Use the heat source that your doctor tells you to use, such as a moist heat pack or a heating pad. ? Place a towel between your skin and the heat source. ? Leave the heat on for 20-30 minutes. ? Remove the heat if your skin turns bright red. This is very important if you are unable to feel pain,   heat, or cold. You may have a greater risk of getting burned.      Activity  Return to your normal activities as told by your doctor. Ask your doctor what activities are safe for you.  Avoid activities that make your symptoms worse.  Take short rests during the day. ? When you rest for a long time, do some physical activity or stretching between periods of rest. ? Avoid sitting for a long time without moving. Get up and move around at least one time each hour.  Exercise and stretch regularly, as told by your doctor.  Do not lift anything that is heavier than 10 lb (4.5 kg)  while you have symptoms of sciatica. ? Avoid lifting heavy things even when you do not have symptoms. ? Avoid lifting heavy things over and over.  When you lift objects, always lift in a way that is safe for your body. To do this, you should: ? Bend your knees. ? Keep the object close to your body. ? Avoid twisting.   General instructions  Stay at a healthy weight.  Wear comfortable shoes that support your feet. Avoid wearing high heels.  Avoid sleeping on a mattress that is too soft or too hard. You might have less pain if you sleep on a mattress that is firm enough to support your back.  Keep all follow-up visits as told by your doctor. This is important. Contact a doctor if:  You have pain that: ? Wakes you up when you are sleeping. ? Gets worse when you lie down. ? Is worse than the pain you have had in the past. ? Lasts longer than 4 weeks.  You lose weight without trying. Get help right away if:  You cannot control when you pee (urinate) or poop (have a bowel movement).  You have weakness in any of these areas and it gets worse: ? Lower back. ? The area between your hip bones. ? Butt. ? Legs.  You have redness or swelling of your back.  You have a burning feeling when you pee. Summary  Sciatica is pain, weakness, tingling, or loss of feeling (numbness) along the sciatic nerve.  This condition happens when the sciatic nerve is pinched or has pressure put on it.  Sciatica can cause pain, tingling, or loss of feeling (numbness) in the lower back, legs, hips, and butt.  Treatment often includes rest, exercise, medicines, and putting ice or heat on the affected area. This information is not intended to replace advice given to you by your health care provider. Make sure you discuss any questions you have with your health care provider. Document Revised: 03/26/2018 Document Reviewed: 03/26/2018 Elsevier Patient Education  2021 Elsevier Inc.  

## 2020-07-16 ENCOUNTER — Other Ambulatory Visit: Payer: Self-pay

## 2020-07-16 DIAGNOSIS — Z0000 Encounter for general adult medical examination without abnormal findings: Secondary | ICD-10-CM

## 2020-07-22 ENCOUNTER — Other Ambulatory Visit: Payer: BC Managed Care – PPO

## 2020-07-22 ENCOUNTER — Other Ambulatory Visit: Payer: Self-pay

## 2020-07-22 DIAGNOSIS — G959 Disease of spinal cord, unspecified: Secondary | ICD-10-CM

## 2020-07-22 DIAGNOSIS — Z0000 Encounter for general adult medical examination without abnormal findings: Secondary | ICD-10-CM

## 2020-07-23 LAB — CBC
Hematocrit: 44.2 % (ref 37.5–51.0)
Hemoglobin: 15.3 g/dL (ref 13.0–17.7)
MCH: 28.8 pg (ref 26.6–33.0)
MCHC: 34.6 g/dL (ref 31.5–35.7)
MCV: 83 fL (ref 79–97)
Platelets: 253 10*3/uL (ref 150–450)
RBC: 5.31 x10E6/uL (ref 4.14–5.80)
RDW: 13.1 % (ref 11.6–15.4)
WBC: 5.4 10*3/uL (ref 3.4–10.8)

## 2020-07-23 LAB — COMPREHENSIVE METABOLIC PANEL
ALT: 25 IU/L (ref 0–44)
AST: 21 IU/L (ref 0–40)
Albumin/Globulin Ratio: 1.6 (ref 1.2–2.2)
Albumin: 4.3 g/dL (ref 4.0–5.0)
Alkaline Phosphatase: 48 IU/L (ref 44–121)
BUN/Creatinine Ratio: 12 (ref 9–20)
BUN: 13 mg/dL (ref 6–20)
Bilirubin Total: 0.4 mg/dL (ref 0.0–1.2)
CO2: 24 mmol/L (ref 20–29)
Calcium: 9.1 mg/dL (ref 8.7–10.2)
Chloride: 101 mmol/L (ref 96–106)
Creatinine, Ser: 1.05 mg/dL (ref 0.76–1.27)
Globulin, Total: 2.7 g/dL (ref 1.5–4.5)
Glucose: 94 mg/dL (ref 65–99)
Potassium: 4.2 mmol/L (ref 3.5–5.2)
Sodium: 138 mmol/L (ref 134–144)
Total Protein: 7 g/dL (ref 6.0–8.5)
eGFR: 93 mL/min/{1.73_m2} (ref >59)

## 2020-07-23 LAB — LIPID PANEL
Chol/HDL Ratio: 6.9 ratio — ABNORMAL HIGH (ref 0.0–5.0)
Cholesterol, Total: 201 mg/dL — ABNORMAL HIGH (ref 100–199)
HDL: 29 mg/dL — ABNORMAL LOW (ref >39)
LDL Chol Calc (NIH): 110 mg/dL — ABNORMAL HIGH (ref 0–99)
Triglycerides: 361 mg/dL — ABNORMAL HIGH (ref 0–149)
VLDL Cholesterol Cal: 62 mg/dL — ABNORMAL HIGH (ref 5–40)

## 2020-07-23 LAB — HEMOGLOBIN A1C
Est. average glucose Bld gHb Est-mCnc: 108 mg/dL
Hgb A1c MFr Bld: 5.4 % (ref 4.8–5.6)

## 2020-07-23 LAB — TSH: TSH: 2.31 u[IU]/mL (ref 0.450–4.500)

## 2020-07-23 NOTE — Progress Notes (Signed)
Elevated lipid panel. Discuss with patient at visit 07/29/2020.

## 2020-07-29 ENCOUNTER — Encounter: Payer: Self-pay | Admitting: Nurse Practitioner

## 2020-07-29 ENCOUNTER — Encounter: Payer: BC Managed Care – PPO | Admitting: Nurse Practitioner

## 2020-07-29 ENCOUNTER — Ambulatory Visit (INDEPENDENT_AMBULATORY_CARE_PROVIDER_SITE_OTHER): Payer: BC Managed Care – PPO | Admitting: Nurse Practitioner

## 2020-07-29 ENCOUNTER — Other Ambulatory Visit: Payer: Self-pay

## 2020-07-29 VITALS — BP 108/68 | HR 67 | Temp 98.0°F | Ht 69.0 in | Wt 202.9 lb

## 2020-07-29 DIAGNOSIS — L740 Miliaria rubra: Secondary | ICD-10-CM

## 2020-07-29 DIAGNOSIS — Z0001 Encounter for general adult medical examination with abnormal findings: Secondary | ICD-10-CM

## 2020-07-29 DIAGNOSIS — M5127 Other intervertebral disc displacement, lumbosacral region: Secondary | ICD-10-CM

## 2020-07-29 DIAGNOSIS — E782 Mixed hyperlipidemia: Secondary | ICD-10-CM

## 2020-07-29 NOTE — Progress Notes (Signed)
Established Patient Office Visit  Subjective:  Patient ID: Brendan Lawson, male    DOB: 12/08/1980  Age: 40 y.o. MRN: [id number]  CC:  Chief Complaint  Patient presents with  . Annual Exam    HPI Brendan D Rebert presents for annual physical. Had been having pain in right buttock, radiating to the right upper leg and behind the right knee. Previously had x-ray of right hip showed some degenerative disc disease in lumbar spine. He has had MRI of lumbar spine since his last visit. This did show right paracentral disc herniation at L5/S1 and mild disc bulge with small right paracentral disc protrusion at L4/L5. This is/was likely causing compression of spinal nerves, resulting in pain of right buttock and right upper leg. He states that this has recently been better. Has been feeling nearly normal. States that he has had a little time off from work and has been able to rest some. Offered referral to see sports medicine or spine specialist, however, patient wishes to hold off on referral for now. Would like to continue with exercises and home PT to improve pain. Will reassess at later date.  Labs done since he was last seen. Lipid panel was moderately elevated.   Lipid Panel     Component Value Date/Time   CHOL 201 (H) 07/22/2020 0855   TRIG 361 (H) 07/22/2020 0855   HDL 29 (L) 07/22/2020 0855   CHOLHDL 6.9 (H) 07/22/2020 0855   LDLCALC 110 (H) 07/22/2020 0855   LABVLDL 62 (H) 07/22/2020 0855   Discussed starting statin therapy versus diet and lifestyle modifications to help lower panel. For now, will try increasing cardiovascular exercises. Will consume a healthier diet. Encouraged him to add OTC fish oil to help lower lipids. Will recheck panel in 6 months and reassess need for statin therapy.  The patient has no new concerns or complaints today. He denies chest pain, chest pressure, or shortness of breath. He has good blood pressure. He denies headaches or changes in his vision. He denies  abdominal pain, nausea, vomiting, or changes in bowel or bladder habits.   Past Medical History:  Diagnosis Date  . Allergy   . GERD (gastroesophageal reflux disease)    [facility] 06/30/2020    Past Surgical History:  Procedure Laterality Date  . NASAL FRACTURE SURGERY    . VASECTOMY N/A    [facility] 06/30/2020    Family History  Problem Relation Age of Onset  . Cancer Mother        breast   . Cancer Father        skin  . Cancer Maternal Grandfather        throat   . Heart disease Paternal Grandmother   . Cancer Other     Social History   Socioeconomic History  . Marital status: Married    Spouse name: Not on file  . Number of children: Not on file  . Years of education: Not on file  . Highest education level: Not on file  Occupational History  . Not on file  Tobacco Use  . Smoking status: Never Smoker  . Smokeless tobacco: Never Used  Substance and Sexual Activity  . Alcohol use: Yes    Comment: social   . Drug use: Never    Types: Marijuana  . Sexual activity: Yes    Partners: Female    Birth control/protection: None    Comment: Married  Other Topics Concern  . Not on file  Social History  Narrative   Marital status: married      Children: none      Employment:  Public affairs consultant; also works in Radio broadcast assistant.      Tobacco: none      Alcohol:  Socially; weekends      Drugs: none   Social Determinants of Health   Financial Resource Strain: Not on file  Food Insecurity: Not on file  Transportation Needs: Not on file  Physical Activity: Not on file  Stress: Not on file  Social Connections: Not on file  Intimate Partner Violence: Not on file    Outpatient Medications Prior to Visit  Medication Sig Dispense Refill  . esomeprazole (NEXIUM) 20 MG capsule Take 20 mg by mouth daily at 12 noon.    . Multiple Vitamin (MULTIVITAMIN WITH MINERALS) TABS tablet Take 1 tablet by mouth daily.    . predniSONE (DELTASONE) 20 MG tablet Take 2 tablets (40 mg total)  by mouth daily. 10 tablet 0   No facility-administered medications prior to visit.    Allergies  Allergen Reactions  . Penicillins Hives and Itching  . Amoxicillin [Amoxicillin] Hives    ROS Review of Systems  Constitutional: Negative for activity change, chills and fever.  HENT: Negative for congestion, postnasal drip, rhinorrhea, sinus pressure, sinus pain and sneezing.   Eyes: Negative.   Respiratory: Negative for cough, shortness of breath and wheezing.   Cardiovascular: Negative for chest pain and palpitations.  Gastrointestinal: Negative for constipation, diarrhea, nausea and vomiting.  Endocrine: Negative for cold intolerance, heat intolerance, polydipsia and polyuria.  Genitourinary: Negative for frequency, hematuria, scrotal swelling and testicular pain.  Musculoskeletal: Positive for arthralgias and myalgias. Negative for back pain.       Improved pain in lower back and right leg.   Skin: Positive for rash.       Skin rash present when it gets hot outside. Mostly under his arms, between his thighs. Folds of skin of belly.   Allergic/Immunologic: Negative for environmental allergies.  Neurological: Negative for dizziness, weakness and headaches.  Hematological: Negative for adenopathy.  Psychiatric/Behavioral: The patient is not nervous/anxious.       Objective:    Physical Exam Vitals and nursing note reviewed.  Constitutional:      Appearance: Normal appearance. He is well-developed.  HENT:     Head: Normocephalic and atraumatic.     Right Ear: Ear canal and external ear normal.     Left Ear: Ear canal and external ear normal.     Nose: Nose normal.     Mouth/Throat:     Mouth: Mucous membranes are moist.     Pharynx: Oropharynx is clear.  Eyes:     Extraocular Movements: Extraocular movements intact.     Conjunctiva/sclera: Conjunctivae normal.     Pupils: Pupils are equal, round, and reactive to light.  Cardiovascular:     Rate and Rhythm: Normal rate  and regular rhythm.  Pulmonary:     Effort: Pulmonary effort is normal.     Breath sounds: Normal breath sounds.  Abdominal:     General: Bowel sounds are normal.     Palpations: Abdomen is soft. There is no mass.     Tenderness: There is no abdominal tenderness.     Hernia: No hernia is present.  Musculoskeletal:        General: Normal range of motion.     Cervical back: Normal range of motion and neck supple.  Skin:    General: Skin is warm  and dry.     Capillary Refill: Capillary refill takes less than 2 seconds.  Neurological:     General: No focal deficit present.     Mental Status: He is alert and oriented to person, place, and time.  Psychiatric:        Mood and Affect: Mood normal.        Behavior: Behavior normal.        Thought Content: Thought content normal.        Judgment: Judgment normal.     Today's Vitals   07/29/20 1531  BP: 108/68  Pulse: 67  Temp: 98 F (36.7 C)  SpO2: 96%  Weight: 202 lb 14.4 oz (92 kg)   Body mass index is 29.96 kg/m.   Wt Readings from Last 3 Encounters:  07/29/20 202 lb 14.4 oz (92 kg)  07/01/20 202 lb 6.4 oz (91.8 kg)  02/17/16 194 lb 6.4 oz (88.2 kg)     There are no preventive care reminders to display for this patient.  There are no preventive care reminders to display for this patient.  Lab Results  Component Value Date   TSH 2.310 07/22/2020   Lab Results  Component Value Date   WBC 5.4 07/22/2020   HGB 15.3 07/22/2020   HCT 44.2 07/22/2020   MCV 83 07/22/2020   PLT 253 07/22/2020   Lab Results  Component Value Date   NA 138 07/22/2020   K 4.2 07/22/2020   CO2 24 07/22/2020   GLUCOSE 94 07/22/2020   BUN 13 07/22/2020   CREATININE 1.05 07/22/2020   BILITOT 0.4 07/22/2020   ALKPHOS 48 07/22/2020   AST 21 07/22/2020   ALT 25 07/22/2020   PROT 7.0 07/22/2020   ALBUMIN 4.3 07/22/2020   CALCIUM 9.1 07/22/2020   EGFR 93 07/22/2020   Lab Results  Component Value Date   CHOL 201 (H) 07/22/2020    Lab Results  Component Value Date   HDL 29 (L) 07/22/2020   Lab Results  Component Value Date   LDLCALC 110 (H) 07/22/2020   Lab Results  Component Value Date   TRIG 361 (H) 07/22/2020   Lab Results  Component Value Date   CHOLHDL 6.9 (H) 07/22/2020   Lab Results  Component Value Date   HGBA1C 5.4 07/22/2020      Assessment & Plan:  1. Encounter for general adult medical examination with abnormal findings Appointment today for annual physical.   2. Herniation of intervertebral disc between L5 and S1 Reviewed MRI of the lumbar spine with the patient which did show herniation of L5/S1 intervertebral disc. Offered referral to neurosurgery or orthopedics. Patient does not wish to pursue this right now as symptoms are nearly resolved. Will reassess and supply referral if patient changes his mind.   3. Mixed hyperlipidemia Reviewed labs with patient showing elevation of lipid panel. Reviewed diet and lifestyle changes which could be made to help lower cholesterol without adding medications at this time. Written information provided to support discussion. Will recheck lipid panel in 6 months and treat as indicated.   4. Heat rash Recommend use of OTC lotrimin cream/ointment. Apply to all effected areas as indicated to treat heat rash. May try using anti-gfungal powder to keep all areas dry and prevent further heat rash from developing.   Problem List Items Addressed This Visit      Musculoskeletal and Integument   Herniation of intervertebral disc between L5 and S1   Heat rash  Other   Encounter for general adult medical examination with abnormal findings - Primary   Mixed hyperlipidemia        Follow-up: Return in about 6 months (around 01/29/2021) for high cholesterol - fasting lipid panel about 1 week prior.    Ronnell Freshwater, NP

## 2020-07-29 NOTE — Patient Instructions (Signed)
Herniated Disk  A herniated disk happens when a disk in the spine bulges out too far. There is an oval disk between each pair of bones (vertebrae) in the backbone or spine. The disks connect the bones, help the spine move, and keep the bones from rubbing against each other when you move. A herniated disk can happen anywhere in the back or neck area. It most often affects the lower back. What are the causes? This condition may be caused by:  Wear and tear as you age.  Sudden injury, such as a strain or sprain. What increases the risk? The following factors may make you more likely to develop this condition:  Age. The older you are the higher the risk.  Being a man who is 69-72 years old.  Doing activities that involve heavy lifting, bending, or twisting.  Not getting enough exercise.  Being overweight.  Smoking or using tobacco. What are the signs or symptoms? Symptoms may vary depending on where the herniated disk is in your body.  Sharp pain in the arm, hip, butt, or in the lower back. The pain can spread to the leg and foot.  Dizziness.  A feeling that things are moving when they are not (vertigo).  Pain or weakness in the neck, shoulder, upper or lower arm, or fingers.  Muscle weakness. You may not be able to: ? Lift your arm or leg. ? Stand on your toes. ? Squeeze with your hands.  Loss of feeling (numbness) or tingling in the hands, arms, feet, or legs.  Being unable to control when to poop or pee. This is rare but serious. How is this treated? This condition may be treated with:  Resting for a few days or several weeks. ? Do not do things that need a lot of effort. Do not go into complete bed rest. Do only light activities. ? If you have a herniated disk in your lower back, limit how much you sit. Sitting puts more pressure on the disk.  Medicines for pain, swelling, or tense muscles.  Ice or heat therapy.  Steroid shots. These can reduce pain and  swelling.  Physical therapy to strengthen your back. Follow these instructions at home: Medicines  Take over-the-counter and prescription medicines only as told by your doctor.  If told, take steps to prevent problems with pooping (constipation). You may need to: ? Drink enough fluid to keep your pee (urine) pale yellow. ? Take over-the-counter or prescription medicines. ? Eat foods that are high in fiber. These include beans, whole grains, and fresh fruits and vegetables. ? Limit foods that are high in fat and processed sugars. These include fried or sweet foods.  Ask your doctor if you should avoid driving or using machines while you are taking your medicines. Managing pain, stiffness, and swelling  If told, put ice on the affected area. To do this: ? Put ice in a plastic bag. ? Place a towel between your skin and the bag. ? Leave the ice on for 20 minutes, 2-3 times a day.  If told, put heat on the painful area. Use the heat source that your doctor recommends, such as a moist heat pack or a heating pad. ? Place a towel between your skin and the heat source. ? Leave the heat on for 20-30 minutes. Take off the heat or ice if your skin turns bright red. If you cannot feel pain, heat, or cold, you have a greater risk of getting burned.  Activity  Rest as told by your doctor. Avoid strict bed rest. Do only activities that do not cause pain.  After your rest period: ? Return to your normal activities as told by your doctor. Slowly start doing exercises as told. Ask your doctor what activities and exercises are safe for you. ? Use good posture. ? Avoid movements that cause pain. ? Do not lift anything that is heavier than 10 lb (4.5 kg), or the limit that you are told. ? Do not sit or stand for a long time without moving. ? Do not sit for a long time without getting up and moving around.  If exercises (physical therapy) were prescribed, do them as told by your doctor.  Try  to strengthen your back and belly with exercises such as swimming or walking. General instructions  Do not smoke or use any products that contain nicotine or tobacco. If you need help quitting, ask your doctor.  Do not wear high-heeled shoes.  Do not sleep on your belly.  If you are overweight, work with your doctor to lose weight safely.  Keep all follow-up visits. How is this prevented?  Stay at a healthy weight.  Stay in shape. Do at least 150 minutes of moderate-intensity exercise each week, such as fast walking or water aerobics.  When lifting objects: ? Keep your feet as far apart as your shoulders or farther apart. ? Tighten your belly muscles. ? Bend your knees and hips, and keep your spine neutral. Lift using the strength of your legs, not your back. Do not lock your knees straight out. ? Always ask for help to lift heavy or large objects. Contact a doctor if:  You have back pain or neck pain that does not get better after 6 weeks.  You have very bad pain in your back, neck, legs, or arms.  You get any of these problems in any part of your body: ? Tingling. ? Weakness. ? Loss of feeling. Get help right away if:  You cannot move your arms or legs.  You cannot control when you pee or poop.  You feel dizzy.  You faint.  You have trouble breathing. These symptoms may be an emergency. Get help right away. Call your local emergency services (911 in the [place]  Do not wait to see if the symptoms will go away.  Do not drive yourself to the hospital. Summary  A herniated disk happens when a disk in your backbone bulges out too far.  This condition may be caused by wear and tear as you age or a sudden injury.  Symptoms may vary depending on where your herniated disk is in your body.  Treatment may include rest, medicines, ice or heat therapy, steroid shots, and exercises. This information is not intended to replace advice given to you by your health care  provider. Make sure you discuss any questions you have with your health care provider. Document Revised: 06/26/2019 Document Reviewed: 06/26/2019 Elsevier Patient Education  2021 [employer].

## 2020-07-30 NOTE — Progress Notes (Signed)
Reviewed with patient at time of visit. Diet and lifestyle changes recommended. Will start statin if no improvement or worsening of panel in next 6 months.

## 2020-08-10 DIAGNOSIS — L740 Miliaria rubra: Secondary | ICD-10-CM | POA: Insufficient documentation

## 2020-08-10 DIAGNOSIS — M5127 Other intervertebral disc displacement, lumbosacral region: Secondary | ICD-10-CM | POA: Insufficient documentation

## 2020-08-10 DIAGNOSIS — E782 Mixed hyperlipidemia: Secondary | ICD-10-CM | POA: Insufficient documentation

## 2020-08-10 DIAGNOSIS — Z0001 Encounter for general adult medical examination with abnormal findings: Secondary | ICD-10-CM | POA: Insufficient documentation

## 2020-10-11 ENCOUNTER — Encounter: Payer: Self-pay | Admitting: Emergency Medicine

## 2020-10-11 ENCOUNTER — Ambulatory Visit
Admission: EM | Admit: 2020-10-11 | Discharge: 2020-10-11 | Disposition: A | Discharge status: Home / Self Care | Payer: BC Managed Care – PPO | Attending: Urgent Care | Admitting: Urgent Care

## 2020-10-11 ENCOUNTER — Other Ambulatory Visit: Payer: Self-pay

## 2020-10-11 DIAGNOSIS — R070 Pain in throat: Secondary | ICD-10-CM | POA: Diagnosis present

## 2020-10-11 DIAGNOSIS — B349 Viral infection, unspecified: Secondary | ICD-10-CM | POA: Diagnosis present

## 2020-10-11 DIAGNOSIS — R52 Pain, unspecified: Secondary | ICD-10-CM | POA: Diagnosis present

## 2020-10-11 LAB — POCT RAPID STREP A (OFFICE): Rapid Strep A Screen: NEGATIVE

## 2020-10-11 MED ORDER — PSEUDOEPHEDRINE HCL 60 MG PO TABS: 60.0000 mg | ORAL_TABLET | Freq: Three times a day (TID) | ORAL | 0 refills | Status: DC | PRN

## 2020-10-11 MED ORDER — CETIRIZINE HCL 10 MG PO TABS: 10.0000 mg | ORAL_TABLET | Freq: Every day | ORAL | 0 refills | Status: DC

## 2020-10-11 MED ORDER — FLUTICASONE PROPIONATE 50 MCG/ACT NA SUSP: 2.0000 | Freq: Every day | NASAL | 12 refills | Status: DC

## 2020-10-11 NOTE — ED Triage Notes (Signed)
Pt sts sore throat x 3 days with body aches; pt sts hx of strep in past

## 2020-10-11 NOTE — Discharge Instructions (Addendum)

## 2020-10-11 NOTE — ED Provider Notes (Signed)
[facility]   MRN: [id number] DOB: Aug 08, 1980  Subjective:   Brendan Lawson is a 40 y.o. male presenting for 3-day history of acute onset throat pain, body aches, chills, malaise.  Patient has concerns about strep throat as he has had this twice in the past 5 years.  He did have exposure to COVID-19 but was limited as he did not come into close contact with his family member.  However his family has been out and about in the general public. Has COVID vaccination, no booster.  No chest pain, shortness of breath.  He presents with his wife and 2 kids who also have similar symptoms.  No current facility-administered medications for this encounter.  Current Outpatient Medications:    esomeprazole (NEXIUM) 20 MG capsule, Take 20 mg by mouth daily at 12 noon., Disp: , Rfl:    Multiple Vitamin (MULTIVITAMIN WITH MINERALS) TABS tablet, Take 1 tablet by mouth daily., Disp: , Rfl:    Allergies  Allergen Reactions   Penicillins Hives and Itching   Amoxicillin [Amoxicillin] Hives    Past Medical History:  Diagnosis Date   Allergy    GERD (gastroesophageal reflux disease)    [facility] 06/30/2020     Past Surgical History:  Procedure Laterality Date   NASAL FRACTURE SURGERY     VASECTOMY N/A    [facility] 06/30/2020    Family History  Problem Relation Age of Onset   Cancer Mother        breast    Cancer Father        skin   Cancer Maternal Grandfather        throat    Heart disease Paternal Grandmother    Cancer Other     Social History   Tobacco Use   Smoking status: Never   Smokeless tobacco: Never  Substance Use Topics   Alcohol use: Yes    Comment: social    Drug use: Never    Types: Marijuana    ROS   Objective:   Vitals: BP 126/83 (BP Location: Right Arm)   Pulse 95   Temp 99.7 F (37.6 C) (Oral)   Resp 18   SpO2 96%   Physical Exam Constitutional:      General: He is not in acute distress.    Appearance: Normal appearance. He is  well-developed. He is not ill-appearing, toxic-appearing or diaphoretic.  HENT:     Head: Normocephalic and atraumatic.     Right Ear: External ear normal.     Left Ear: External ear normal.     Nose: Nose normal.     Mouth/Throat:     Mouth: Mucous membranes are moist.     Pharynx: Posterior oropharyngeal erythema present. No oropharyngeal exudate.     Comments: Thick streaks of postnasal drainage overlying pharynx. Eyes:     General: No scleral icterus.    Extraocular Movements: Extraocular movements intact.     Pupils: Pupils are equal, round, and reactive to light.  Cardiovascular:     Rate and Rhythm: Normal rate and regular rhythm.     Heart sounds: Normal heart sounds. No murmur heard.   No friction rub. No gallop.  Pulmonary:     Effort: Pulmonary effort is normal. No respiratory distress.     Breath sounds: Normal breath sounds. No stridor. No wheezing, rhonchi or rales.  Neurological:     Mental Status: He is alert and oriented to person, place, and time.  Psychiatric:  Mood and Affect: Mood normal.        Behavior: Behavior normal.        Thought Content: Thought content normal.    Results for orders placed or performed during the hospital encounter of 10/11/20 (from the past 24 hour(s))  POCT rapid strep A     Status: None   Collection Time: 10/11/20 11:15 AM  Result Value Ref Range   Rapid Strep A Screen Negative Negative    Assessment and Plan :   PDMP not reviewed this encounter.  1. Viral syndrome   2. Throat pain   3. Body aches     Will manage for viral illness such as viral URI, viral syndrome, viral rhinitis, COVID-19. Counseled patient on nature of COVID-19 including modes of transmission, diagnostic testing, management and supportive care.  Offered scripts for symptomatic relief. COVID 19 testing is pending. Counseled patient on potential for adverse effects with medications prescribed/recommended today, ER and return-to-clinic precautions  discussed, patient verbalized understanding.     Wallis Bamberg, PA-C 10/11/20 1144

## 2020-10-12 LAB — NOVEL CORONAVIRUS, NAA: SARS-CoV-2, NAA: NOT DETECTED

## 2020-10-12 LAB — SARS-COV-2, NAA 2 DAY TAT

## 2020-10-13 LAB — CULTURE, GROUP A STREP (THRC)

## 2021-01-22 ENCOUNTER — Other Ambulatory Visit: Payer: Self-pay

## 2021-01-22 DIAGNOSIS — E782 Mixed hyperlipidemia: Secondary | ICD-10-CM

## 2021-01-25 ENCOUNTER — Other Ambulatory Visit: Payer: BC Managed Care – PPO

## 2021-01-25 ENCOUNTER — Other Ambulatory Visit: Payer: Self-pay

## 2021-01-25 DIAGNOSIS — E782 Mixed hyperlipidemia: Secondary | ICD-10-CM

## 2021-01-26 LAB — LIPID PANEL
Chol/HDL Ratio: 6.3 ratio — ABNORMAL HIGH (ref 0.0–5.0)
Cholesterol, Total: 188 mg/dL (ref 100–199)
HDL: 30 mg/dL — ABNORMAL LOW (ref >39)
LDL Chol Calc (NIH): 117 mg/dL — ABNORMAL HIGH (ref 0–99)
Triglycerides: 233 mg/dL — ABNORMAL HIGH (ref 0–149)
VLDL Cholesterol Cal: 41 mg/dL — ABNORMAL HIGH (ref 5–40)

## 2021-02-01 ENCOUNTER — Other Ambulatory Visit: Payer: Self-pay

## 2021-02-01 ENCOUNTER — Encounter: Payer: Self-pay | Admitting: Nurse Practitioner

## 2021-02-01 ENCOUNTER — Ambulatory Visit (INDEPENDENT_AMBULATORY_CARE_PROVIDER_SITE_OTHER): Payer: BC Managed Care – PPO | Admitting: Nurse Practitioner

## 2021-02-01 VITALS — BP 104/69 | HR 62 | Temp 98.1°F | Ht 69.0 in | Wt 203.3 lb

## 2021-02-01 DIAGNOSIS — Z6830 Body mass index (BMI) 30.0-30.9, adult: Secondary | ICD-10-CM | POA: Diagnosis not present

## 2021-02-01 DIAGNOSIS — E782 Mixed hyperlipidemia: Secondary | ICD-10-CM

## 2021-02-01 DIAGNOSIS — Z23 Encounter for immunization: Secondary | ICD-10-CM | POA: Diagnosis not present

## 2021-02-01 NOTE — Progress Notes (Signed)
Established Patient Office Visit  Subjective:  Patient ID: Brendan Lawson, male    DOB: [birth year]  Age: 40 y.o. MRN: [id number]  CC:  Chief Complaint  Patient presents with   Follow-up   Hyperlipidemia    HPI Brendan D Azar presents for follow up of high cholesterol. Has been eating a healthier diet and increased his exercise. He is also taking fish oil daily. Though still elevated, his lipid panel has improved.   Lipid Panel     Component Value Date/Time   CHOL 188 01/25/2021 0917   TRIG 233 (H) 01/25/2021 0917   HDL 30 (L) 01/25/2021 0917   CHOLHDL 6.3 (H) 01/25/2021 0917   LDLCALC 117 (H) 01/25/2021 0917   LABVLDL 41 (H) 01/25/2021 0917    This has come down quite a bit from last check. Patient would like to remain off of lipid lowering medications if possible.  He has no new concerns or complaints today.  He denies chest pain, chest pressure, or shortness of breath. He denies headaches or visual disturbances. He denies abdominal pain, nausea, vomiting, or changes in bowel or bladder habits.     Past Medical History:  Diagnosis Date   Allergy    GERD (gastroesophageal reflux disease)    [facility] 06/30/2020    Past Surgical History:  Procedure Laterality Date   NASAL FRACTURE SURGERY     VASECTOMY N/A    [facility] 06/30/2020    Family History  Problem Relation Age of Onset   Cancer Mother        breast    Cancer Father        skin   Cancer Maternal Grandfather        throat    Heart disease Paternal Grandmother    Cancer Other     Social History   Socioeconomic History   Marital status: Married    Spouse name: Not on file   Number of children: Not on file   Years of education: Not on file   Highest education level: Not on file  Occupational History   Not on file  Tobacco Use   Smoking status: Never   Smokeless tobacco: Never  Substance and Sexual Activity   Alcohol use: Yes    Comment: social    Drug use: Never    Types: Marijuana   Sexual  activity: Yes    Partners: Female    Birth control/protection: None    Comment: Married  Other Topics Concern   Not on file  Social History Narrative   Marital status: married      Children: none      Employment:  Public affairs consultant; also works in another Proofreader.      Tobacco: none      Alcohol:  Socially; weekends      Drugs: none   Social Determinants of Health   Financial Resource Strain: Not on file  Food Insecurity: Not on file  Transportation Needs: Not on file  Physical Activity: Not on file  Stress: Not on file  Social Connections: Not on file  Intimate Partner Violence: Not on file    Outpatient Medications Prior to Visit  Medication Sig Dispense Refill   esomeprazole (NEXIUM) 20 MG capsule Take 20 mg by mouth daily at 12 noon.     Multiple Vitamin (MULTIVITAMIN WITH MINERALS) TABS tablet Take 1 tablet by mouth daily.     cetirizine (ZYRTEC ALLERGY) 10 MG tablet Take 1 tablet (10 mg total) by mouth daily.  30 tablet 0   fluticasone (FLONASE) 50 MCG/ACT nasal spray Place 2 sprays into both nostrils daily. 16 g 12   pseudoephedrine (SUDAFED) 60 MG tablet Take 1 tablet (60 mg total) by mouth every 8 (eight) hours as needed for congestion. 30 tablet 0   No facility-administered medications prior to visit.    Allergies  Allergen Reactions   Penicillins Hives and Itching   Amoxicillin [Amoxicillin] Hives    ROS Review of Systems  Constitutional:  Negative for activity change, chills, fatigue and fever.       Patient reports improved diet with eating more fresh vegetables and healthy meats.  Has reduced intake of foods that are high in fat and cholesterol.  Avoiding fast foods.  He is exercising more frequently.  HENT:  Negative for congestion, postnasal drip, rhinorrhea, sinus pressure, sinus pain, sneezing and sore throat.   Eyes: Negative.   Respiratory:  Negative for cough, shortness of breath and wheezing.   Cardiovascular:  Negative for chest pain and  palpitations.  Gastrointestinal:  Negative for constipation, diarrhea, nausea and vomiting.  Endocrine: Negative for cold intolerance, heat intolerance, polydipsia and polyuria.  Genitourinary:  Negative for dysuria, frequency and urgency.  Musculoskeletal:  Negative for back pain and myalgias.  Skin:  Negative for rash.  Allergic/Immunologic: Negative for environmental allergies.  Neurological:  Negative for dizziness, weakness and headaches.  Psychiatric/Behavioral:  The patient is not nervous/anxious.      Objective:    Physical Exam Vitals and nursing note reviewed.  Constitutional:      Appearance: Normal appearance. He is well-developed.  HENT:     Head: Normocephalic and atraumatic.     Nose: Nose normal.     Mouth/Throat:     Mouth: Mucous membranes are moist.  Eyes:     Extraocular Movements: Extraocular movements intact.     Conjunctiva/sclera: Conjunctivae normal.     Pupils: Pupils are equal, round, and reactive to light.  Cardiovascular:     Rate and Rhythm: Normal rate and regular rhythm.     Pulses: Normal pulses.     Heart sounds: Normal heart sounds.  Pulmonary:     Effort: Pulmonary effort is normal.     Breath sounds: Normal breath sounds.  Abdominal:     Palpations: Abdomen is soft.  Musculoskeletal:        General: Normal range of motion.     Cervical back: Normal range of motion and neck supple.  Lymphadenopathy:     Cervical: No cervical adenopathy.  Skin:    General: Skin is warm and dry.     Capillary Refill: Capillary refill takes less than 2 seconds.  Neurological:     General: No focal deficit present.     Mental Status: He is alert and oriented to person, place, and time.  Psychiatric:        Mood and Affect: Mood normal.        Behavior: Behavior normal.        Thought Content: Thought content normal.        Judgment: Judgment normal.    Today's Vitals   02/01/21 1546  BP: 104/69  Pulse: 62  Temp: 98.1 F (36.7 C)  SpO2: 96%   Weight: 203 lb 4.8 oz (92.2 kg)  Height: 5' 9"  (1.753 m)   Body mass index is 30.02 kg/m.   Wt Readings from Last 3 Encounters:  02/01/21 203 lb 4.8 oz (92.2 kg)  07/29/20 202 lb 14.4 oz (92 kg)  07/01/20 202 lb 6.4 oz (91.8 kg)     Health Maintenance Due  Topic Date Due   COVID-19 Vaccine (1) Never done    There are no preventive care reminders to display for this patient.  Lab Results  Component Value Date   TSH 2.310 07/22/2020   Lab Results  Component Value Date   WBC 5.4 07/22/2020   HGB 15.3 07/22/2020   HCT 44.2 07/22/2020   MCV 83 07/22/2020   PLT 253 07/22/2020   Lab Results  Component Value Date   NA 138 07/22/2020   K 4.2 07/22/2020   CO2 24 07/22/2020   GLUCOSE 94 07/22/2020   BUN 13 07/22/2020   CREATININE 1.05 07/22/2020   BILITOT 0.4 07/22/2020   ALKPHOS 48 07/22/2020   AST 21 07/22/2020   ALT 25 07/22/2020   PROT 7.0 07/22/2020   ALBUMIN 4.3 07/22/2020   CALCIUM 9.1 07/22/2020   EGFR 93 07/22/2020   Lab Results  Component Value Date   CHOL 188 01/25/2021   Lab Results  Component Value Date   HDL 30 (L) 01/25/2021   Lab Results  Component Value Date   LDLCALC 117 (H) 01/25/2021   Lab Results  Component Value Date   TRIG 233 (H) 01/25/2021   Lab Results  Component Value Date   CHOLHDL 6.3 (H) 01/25/2021   Lab Results  Component Value Date   HGBA1C 5.4 07/22/2020      Assessment & Plan:  1. Mixed hyperlipidemia Overall, patient's lipid panel has improved.  We will have him continue to follow a low-fat, low-cholesterol diet, daily with increased physical exercise.  We will have him increase fish oil to 2 capsules daily.  Recheck in 6 months.  2. Body mass index (BMI) of 30.0-30.9 in adult Encourage patient to limit calorie intake to 2000 cal/day or less.  He should consume a low cholesterol, low-fat diet.  Patient should incorporate exercise into his daily routine.   3. Need for Tdap vaccination Tdap vaccine administered  during today's visit. - Tdap vaccine greater than or equal to 7yo IM   Problem List Items Addressed This Visit       Other   Mixed hyperlipidemia - Primary   Body mass index (BMI) of 30.0-30.9 in adult   Other Visit Diagnoses     Need for Tdap vaccination       Relevant Orders   Tdap vaccine greater than or equal to 7yo IM (Completed)        Follow-up: Return in about 6 months (around 08/01/2021) for health maintenance exam, FBW a week prior to visit.    Ronnell Freshwater, NP

## 2021-02-01 NOTE — Progress Notes (Signed)
Dap

## 2021-02-08 DIAGNOSIS — Z6830 Body mass index (BMI) 30.0-30.9, adult: Secondary | ICD-10-CM | POA: Insufficient documentation

## 2021-02-08 NOTE — Patient Instructions (Signed)
Fat and Cholesterol Restricted Eating Plan Getting too much fat and cholesterol in your diet may cause health problems. Choosing the right foods helps keep your fat and cholesterol at normal levels. This can keep you from getting certain diseases. Your doctor may recommend an eating plan that includes: Total fat: ______% or less of total calories a day. This is ______g of fat a day. Saturated fat: ______% or less of total calories a day. This is ______g of saturated fat a day. Cholesterol: less than _________mg a day. Fiber: ______g a day. What are tips for following this plan? General tips Work with your doctor to lose weight if you need to. Avoid: Foods with added sugar. Fried foods. Foods with trans fat or partially hydrogenated oils. This includes some margarines and baked goods. If you drink alcohol: Limit how much you have to: 0-1 drink a day for women who are not pregnant. 0-2 drinks a day for men. Know how much alcohol is in a drink. In the [place] one drink equals one 12 oz bottle of beer (355 mL), one 5 oz glass of wine (148 mL), or one 1 oz glass of hard liquor (44 mL). Reading food labels Check food labels for: Trans fats. Partially hydrogenated oils. Saturated fat (g) in each serving. Cholesterol (mg) in each serving. Fiber (g) in each serving. Choose foods with healthy fats, such as: Monounsaturated fats and polyunsaturated fats. These include olive and canola oil, flaxseeds, walnuts, almonds, and seeds. Omega-3 fats. These are found in certain fish, flaxseed oil, and ground flaxseeds. Choose grain products that have whole grains. Look for the word "whole" as the first word in the ingredient list. Cooking Cook foods using low-fat methods. These include baking, boiling, grilling, and broiling. Eat more home-cooked foods. Eat at restaurants and buffets less often. Eat less fast food. Avoid cooking using saturated fats, such as butter, cream, palm oil, palm kernel oil, and  coconut oil. Meal planning  At meals, divide your plate into four equal parts: Fill one-half of your plate with vegetables, green salads, and fruit. Fill one-fourth of your plate with whole grains. Fill one-fourth of your plate with low-fat (lean) protein foods. Eat fish that is high in omega-3 fats at least two times a week. This includes mackerel, tuna, sardines, and salmon. Eat foods that are high in fiber, such as whole grains, beans, apples, pears, berries, broccoli, carrots, peas, and barley. What foods should I eat? Fruits All fresh, canned (in natural juice), or frozen fruits. Vegetables Fresh or frozen vegetables (raw, steamed, roasted, or grilled). Green salads. Grains Whole grains, such as whole wheat or whole grain breads, crackers, cereals, and pasta. Unsweetened oatmeal, bulgur, barley, quinoa, or brown rice. Corn or whole wheat flour tortillas. Meats and other protein foods Ground beef (85% or leaner), grass-fed beef, or beef trimmed of fat. Skinless chicken or turkey. Ground chicken or turkey. Pork trimmed of fat. All fish and seafood. Egg whites. Dried beans, peas, or lentils. Unsalted nuts or seeds. Unsalted canned beans. Nut butters without added sugar or oil. Dairy Low-fat or nonfat dairy products, such as skim or 1% milk, 2% or reduced-fat cheeses, low-fat and fat-free ricotta or cottage cheese, or plain low-fat and nonfat yogurt. Fats and oils Tub margarine without trans fats. Light or reduced-fat mayonnaise and salad dressings. Avocado. Olive, canola, sesame, or safflower oils. The items listed above may not be a complete list of foods and beverages you can eat. Contact a dietitian for more information. What foods   should I avoid? Fruits Canned fruit in heavy syrup. Fruit in cream or butter sauce. Fried fruit. Vegetables Vegetables cooked in cheese, cream, or butter sauce. Fried vegetables. Grains White bread. White pasta. White rice. Cornbread. Bagels, pastries,  and croissants. Crackers and snack foods that contain trans fat and hydrogenated oils. Meats and other protein foods Fatty cuts of meat. Ribs, chicken wings, bacon, sausage, bologna, salami, chitterlings, fatback, hot dogs, bratwurst, and packaged lunch meats. Liver and organ meats. Whole eggs and egg yolks. Chicken and turkey with skin. Fried meat. Dairy Whole or 2% milk, cream, half-and-half, and cream cheese. Whole milk cheeses. Whole-fat or sweetened yogurt. Full-fat cheeses. Nondairy creamers and whipped toppings. Processed cheese, cheese spreads, and cheese curds. Fats and oils Butter, stick margarine, lard, shortening, ghee, or bacon fat. Coconut, palm kernel, and palm oils. Beverages Alcohol. Sugar-sweetened drinks such as sodas, lemonade, and fruit drinks. Sweets and desserts Corn syrup, sugars, honey, and molasses. Candy. Jam and jelly. Syrup. Sweetened cereals. Cookies, pies, cakes, donuts, muffins, and ice cream. The items listed above may not be a complete list of foods and beverages you should avoid. Contact a dietitian for more information. Summary Choosing the right foods helps keep your fat and cholesterol at normal levels. This can keep you from getting certain diseases. At meals, fill one-half of your plate with vegetables, green salads, and fruits. Eat high fiber foods, like whole grains, beans, apples, pears, berries, carrots, peas, and barley. Limit added sugar, saturated fats, alcohol, and fried foods. This information is not intended to replace advice given to you by your health care provider. Make sure you discuss any questions you have with your health care provider. Document Revised: 07/17/2020 Document Reviewed: 07/17/2020 Elsevier Patient Education  2022 [employer]  

## 2021-07-22 DIAGNOSIS — M950 Acquired deformity of nose: Secondary | ICD-10-CM | POA: Insufficient documentation

## 2021-07-22 DIAGNOSIS — J342 Deviated nasal septum: Secondary | ICD-10-CM | POA: Insufficient documentation

## 2021-08-02 ENCOUNTER — Encounter: Payer: BC Managed Care – PPO | Admitting: Nurse Practitioner

## 2021-08-10 ENCOUNTER — Other Ambulatory Visit: Payer: Self-pay

## 2021-08-10 DIAGNOSIS — Z0000 Encounter for general adult medical examination without abnormal findings: Secondary | ICD-10-CM

## 2021-08-10 DIAGNOSIS — Z130 Encounter for screening for diseases of the blood and blood-forming organs and certain disorders involving the immune mechanism: Secondary | ICD-10-CM

## 2021-08-13 ENCOUNTER — Other Ambulatory Visit: Payer: BC Managed Care – PPO

## 2021-08-13 DIAGNOSIS — Z0000 Encounter for general adult medical examination without abnormal findings: Secondary | ICD-10-CM

## 2021-08-13 DIAGNOSIS — Z130 Encounter for screening for diseases of the blood and blood-forming organs and certain disorders involving the immune mechanism: Secondary | ICD-10-CM

## 2021-08-14 LAB — LIPID PANEL
Chol/HDL Ratio: 5.7 ratio — ABNORMAL HIGH (ref 0.0–5.0)
Cholesterol, Total: 201 mg/dL — ABNORMAL HIGH (ref 100–199)
HDL: 35 mg/dL — ABNORMAL LOW (ref >39)
LDL Chol Calc (NIH): 141 mg/dL — ABNORMAL HIGH (ref 0–99)
Triglycerides: 140 mg/dL (ref 0–149)
VLDL Cholesterol Cal: 25 mg/dL (ref 5–40)

## 2021-08-14 LAB — COMPREHENSIVE METABOLIC PANEL
ALT: 21 IU/L (ref 0–44)
AST: 22 IU/L (ref 0–40)
Albumin/Globulin Ratio: 1.8 (ref 1.2–2.2)
Albumin: 4.5 g/dL (ref 4.0–5.0)
Alkaline Phosphatase: 51 IU/L (ref 44–121)
BUN/Creatinine Ratio: 15 (ref 9–20)
BUN: 16 mg/dL (ref 6–20)
Bilirubin Total: 0.4 mg/dL (ref 0.0–1.2)
CO2: 26 mmol/L (ref 20–29)
Calcium: 9.3 mg/dL (ref 8.7–10.2)
Chloride: 102 mmol/L (ref 96–106)
Creatinine, Ser: 1.05 mg/dL (ref 0.76–1.27)
Globulin, Total: 2.5 g/dL (ref 1.5–4.5)
Glucose: 90 mg/dL (ref 70–99)
Potassium: 4.5 mmol/L (ref 3.5–5.2)
Sodium: 140 mmol/L (ref 134–144)
Total Protein: 7 g/dL (ref 6.0–8.5)
eGFR: 93 mL/min/{1.73_m2} (ref >59)

## 2021-08-14 LAB — CBC WITH DIFFERENTIAL/PLATELET
Basophils Absolute: 0 10*3/uL (ref 0.0–0.2)
Basos: 0 %
EOS (ABSOLUTE): 0.1 10*3/uL (ref 0.0–0.4)
Eos: 2 %
Hematocrit: 44.3 % (ref 37.5–51.0)
Hemoglobin: 14.8 g/dL (ref 13.0–17.7)
Immature Grans (Abs): 0 10*3/uL (ref 0.0–0.1)
Immature Granulocytes: 0 %
Lymphocytes Absolute: 1.7 10*3/uL (ref 0.7–3.1)
Lymphs: 33 %
MCH: 28.4 pg (ref 26.6–33.0)
MCHC: 33.4 g/dL (ref 31.5–35.7)
MCV: 85 fL (ref 79–97)
Monocytes Absolute: 0.4 10*3/uL (ref 0.1–0.9)
Monocytes: 7 %
Neutrophils Absolute: 3 10*3/uL (ref 1.4–7.0)
Neutrophils: 58 %
Platelets: 241 10*3/uL (ref 150–450)
RBC: 5.22 x10E6/uL (ref 4.14–5.80)
RDW: 12.5 % (ref 11.6–15.4)
WBC: 5.2 10*3/uL (ref 3.4–10.8)

## 2021-08-14 LAB — HEMOGLOBIN A1C
Est. average glucose Bld gHb Est-mCnc: 111 mg/dL
Hgb A1c MFr Bld: 5.5 % (ref 4.8–5.6)

## 2021-08-14 LAB — TSH: TSH: 1.88 u[IU]/mL (ref 0.450–4.500)

## 2021-08-17 NOTE — Progress Notes (Signed)
Elevated, but improved lipid panel. Other labs look good. Discuss at CPE 08/18/2021.

## 2021-08-18 ENCOUNTER — Ambulatory Visit (INDEPENDENT_AMBULATORY_CARE_PROVIDER_SITE_OTHER): Payer: BC Managed Care – PPO | Admitting: Nurse Practitioner

## 2021-08-18 ENCOUNTER — Encounter: Payer: Self-pay | Admitting: Nurse Practitioner

## 2021-08-18 VITALS — BP 106/65 | HR 61 | Temp 97.7°F | Ht 70.0 in | Wt 190.0 lb

## 2021-08-18 DIAGNOSIS — Z6827 Body mass index (BMI) 27.0-27.9, adult: Secondary | ICD-10-CM | POA: Diagnosis not present

## 2021-08-18 DIAGNOSIS — L259 Unspecified contact dermatitis, unspecified cause: Secondary | ICD-10-CM | POA: Diagnosis not present

## 2021-08-18 DIAGNOSIS — E782 Mixed hyperlipidemia: Secondary | ICD-10-CM

## 2021-08-18 DIAGNOSIS — Z0001 Encounter for general adult medical examination with abnormal findings: Secondary | ICD-10-CM | POA: Diagnosis not present

## 2021-08-18 NOTE — Progress Notes (Signed)
Complete physical exam   Patient: Brendan Lawson   DOB: 04/17/1980   40 y.o. Male  MRN: [id number] Visit Date: 08/18/2021    Chief Complaint  Patient presents with   Annual Exam   Subjective    Brendan Lawson is a 40 y.o. male who presents today for a complete physical exam.  He reports consuming a general diet. Gym/ health club routine includes cardio. He generally feels well. He does not have additional problems to discuss today.    HPI  The patient had routine, fasting labs prior to this visit.  -lipid panel continues to be generally elevated, but improved since last two checks.  -improved back pain and improved sciatica.  -does have intermittent contact dermatitis. Saw dermatology and got prescription for betamethasone. Uses this twice daily when needed.  -no new concerns or complaints today   Past Medical History:  Diagnosis Date   Allergy    GERD (gastroesophageal reflux disease)    [employer] 06/30/2020   Past Surgical History:  Procedure Laterality Date   NASAL FRACTURE SURGERY     VASECTOMY N/A    [employer] 06/30/2020   Social History   Socioeconomic History   Marital status: Married    Spouse name: Not on file   Number of children: Not on file   Years of education: Not on file   Highest education level: Not on file  Occupational History   Not on file  Tobacco Use   Smoking status: Never   Smokeless tobacco: Never  Substance and Sexual Activity   Alcohol use: Yes    Comment: social    Drug use: Never    Types: Marijuana   Sexual activity: Yes    Partners: Female    Birth control/protection: None    Comment: Married  Other Topics Concern   Not on file  Social History Narrative   Marital status: married      Children: none      Employment:  [employer]; also works in another warehouse.      Tobacco: none      Alcohol:  Socially; weekends      Drugs: none   Social Determinants of Health   Financial Resource Strain: Not on file  Food Insecurity:  Not on file  Transportation Needs: Not on file  Physical Activity: Not on file  Stress: Not on file  Social Connections: Not on file  Intimate Partner Violence: Not on file   Family Status  Relation Name Status   Mother  Alive   Father  Alive   MGF  Deceased   PGM  Deceased   MGM  Deceased   PGF  Deceased   Brother  Alive   Other  (Not Specified)   Family History  Problem Relation Age of Onset   Cancer Mother        breast    Cancer Father        skin   Cancer Maternal Grandfather        throat    Heart disease Paternal Grandmother    Cancer Other    Allergies  Allergen Reactions   Penicillins Hives and Itching   Amoxicillin [Amoxicillin] Hives   Diazepam    Ibuprofen     Patient Care Team: Boscia, Heather E, NP as PCP - General (Family Medicine)   Medications: Outpatient Medications Prior to Visit  Medication Sig   esomeprazole (NEXIUM) 20 MG capsule Take 20 mg by mouth daily at 12 noon.     Multiple Vitamin (MULTIVITAMIN WITH MINERALS) TABS tablet Take 1 tablet by mouth daily.   No facility-administered medications prior to visit.    Review of Systems  Constitutional:  Negative for activity change, chills, fatigue and fever.  HENT:  Negative for congestion, postnasal drip, rhinorrhea, sinus pressure, sinus pain, sneezing and sore throat.   Eyes: Negative.   Respiratory:  Negative for cough, shortness of breath and wheezing.   Cardiovascular:  Negative for chest pain and palpitations.  Gastrointestinal:  Negative for constipation, diarrhea, nausea and vomiting.  Endocrine: Negative for cold intolerance, heat intolerance, polydipsia and polyuria.  Genitourinary:  Negative for dysuria, frequency and urgency.  Musculoskeletal:  Negative for back pain and myalgias.  Skin:  Negative for rash.  Allergic/Immunologic: Negative for environmental allergies.  Neurological:  Negative for dizziness, weakness and headaches.  Psychiatric/Behavioral:  The patient is not  nervous/anxious.    Last CBC Lab Results  Component Value Date   WBC 5.2 08/13/2021   HGB 14.8 08/13/2021   HCT 44.3 08/13/2021   MCV 85 08/13/2021   MCH 28.4 08/13/2021   RDW 12.5 08/13/2021   PLT 241 08/13/2021   Last metabolic panel Lab Results  Component Value Date   GLUCOSE 90 08/13/2021   NA 140 08/13/2021   K 4.5 08/13/2021   CL 102 08/13/2021   CO2 26 08/13/2021   BUN 16 08/13/2021   CREATININE 1.05 08/13/2021   EGFR 93 08/13/2021   CALCIUM 9.3 08/13/2021   PROT 7.0 08/13/2021   ALBUMIN 4.5 08/13/2021   LABGLOB 2.5 08/13/2021   AGRATIO 1.8 08/13/2021   BILITOT 0.4 08/13/2021   ALKPHOS 51 08/13/2021   AST 22 08/13/2021   ALT 21 08/13/2021   Last lipids Lab Results  Component Value Date   CHOL 201 (H) 08/13/2021   HDL 35 (L) 08/13/2021   LDLCALC 141 (H) 08/13/2021   TRIG 140 08/13/2021   CHOLHDL 5.7 (H) 08/13/2021   Last hemoglobin A1c Lab Results  Component Value Date   HGBA1C 5.5 08/13/2021   Last thyroid functions Lab Results  Component Value Date   TSH 1.880 08/13/2021       Objective     Today's Vitals   08/18/21 1516  BP: 106/65  Pulse: 61  Temp: 97.7 F (36.5 C)  SpO2: 97%  Weight: 190 lb (86.2 kg)  Height: 5' 10" (1.778 m)   Body mass index is 27.26 kg/m.   BP Readings from Last 3 Encounters:  08/18/21 106/65  02/01/21 104/69  10/11/20 126/83    Wt Readings from Last 3 Encounters:  08/18/21 190 lb (86.2 kg)  02/01/21 203 lb 4.8 oz (92.2 kg)  07/29/20 202 lb 14.4 oz (92 kg)     Physical Exam Vitals and nursing note reviewed.  Constitutional:      Appearance: Normal appearance. He is well-developed.  HENT:     Head: Normocephalic and atraumatic.     Right Ear: Tympanic membrane, ear canal and external ear normal.     Left Ear: Tympanic membrane, ear canal and external ear normal.     Nose: Nose normal.     Mouth/Throat:     Mouth: Mucous membranes are moist.     Pharynx: Oropharynx is clear.  Eyes:      Extraocular Movements: Extraocular movements intact.     Conjunctiva/sclera: Conjunctivae normal.     Pupils: Pupils are equal, round, and reactive to light.  Cardiovascular:     Rate and Rhythm: Normal rate and regular rhythm.       Pulses: Normal pulses.     Heart sounds: Normal heart sounds.  Pulmonary:     Effort: Pulmonary effort is normal.     Breath sounds: Normal breath sounds.  Abdominal:     General: Bowel sounds are normal. There is no distension.     Palpations: Abdomen is soft. There is no mass.     Tenderness: There is no abdominal tenderness. There is no right CVA tenderness, left CVA tenderness, guarding or rebound.     Hernia: No hernia is present.  Musculoskeletal:        General: Normal range of motion.     Cervical back: Normal range of motion and neck supple.  Lymphadenopathy:     Cervical: No cervical adenopathy.  Skin:    General: Skin is warm and dry.     Capillary Refill: Capillary refill takes less than 2 seconds.  Neurological:     General: No focal deficit present.     Mental Status: He is alert and oriented to person, place, and time.  Psychiatric:        Mood and Affect: Mood normal.        Behavior: Behavior normal.        Thought Content: Thought content normal.        Judgment: Judgment normal.     Last depression screening scores    08/18/2021    3:17 PM 02/01/2021    3:48 PM 07/29/2020    3:33 PM  PHQ 2/9 Scores  PHQ - 2 Score 0 0 0  PHQ- 9 Score 0 0 0   Last fall risk screening    08/18/2021    3:17 PM  Fall Risk   Falls in the past year? 0  Number falls in past yr: 0  Injury with Fall? 0  Risk for fall due to : No Fall Risks  Follow up Falls evaluation completed   Last Audit-C alcohol use screening     View : No data to display.           Assessment & Plan    1. Encounter for general adult medical examination with abnormal findings Annual physical today   2. Mixed hyperlipidemia Recommend patient limit intake of fried  and fatty foods. He should increase intake of lean proteins and green leafy vegetables. Adding exercise into daily routine will also be beneficial.  Recheck fasting lipid panel in 6 months for surveillance.   3. Contact dermatitis, unspecified contact dermatitis type, unspecified trigger Continue to use prescribed topical steroids as prescribed per dermatology.   4. BMI 27.0-27.9,adult Encourage patient to limit calorie intake to 2000 cal/day or less.  He should consume a low cholesterol, low-fat diet.  Continue to incorporate exercise into his daily routine.     Immunization History  Administered Date(s) Administered   Influenza,inj,Quad PF,6+ Mos 01/15/2013, 05/12/2015   Tdap 05/12/2015, 02/01/2021    Health Maintenance  Topic Date Due   HIV Screening  Never done   Hepatitis C Screening  Never done   INFLUENZA VACCINE  10/19/2021   TETANUS/TDAP  02/02/2031   HPV VACCINES  Aged Out    Discussed health benefits of physical activity, and encouraged him to engage in regular exercise appropriate for his age and condition.  Problem List Items Addressed This Visit       Musculoskeletal and Integument   Contact dermatitis     Other   Encounter for general adult medical examination with abnormal findings - Primary   Mixed   hyperlipidemia   BMI 27.0-27.9,adult     Return in about 6 months (around 02/17/2022) for high cholesterol - check fasting lipids a week prior .        Heather E Boscia, NP  [facility] [place] [phone] (phone) [phone] (fax)  [facility]  

## 2021-08-18 NOTE — Patient Instructions (Signed)
Preventive Care 21-39 Years Old, Male Preventive care refers to lifestyle choices and visits with your health care provider that can promote health and wellness. Preventive care visits are also called wellness exams. What can I expect for my preventive care visit? Counseling During your preventive care visit, your health care provider may ask about your: Medical history, including: Past medical problems. Family medical history. Current health, including: Emotional well-being. Home life and relationship well-being. Sexual activity. Lifestyle, including: Alcohol, nicotine or tobacco, and drug use. Access to firearms. Diet, exercise, and sleep habits. Safety issues such as seatbelt and bike helmet use. Sunscreen use. Work and work environment. Physical exam Your health care provider may check your: Height and weight. These may be used to calculate your BMI (body mass index). BMI is a measurement that tells if you are at a healthy weight. Waist circumference. This measures the distance around your waistline. This measurement also tells if you are at a healthy weight and may help predict your risk of certain diseases, such as type 2 diabetes and high blood pressure. Heart rate and blood pressure. Body temperature.  Vaccines are usually given at various ages, according to a schedule. Your health care provider will recommend vaccines for you based on your age, medical history, and lifestyle or other factors, such as travel or where you work. What tests do I need? Screening Your health care provider may recommend screening tests for certain conditions. This may include: Lipid and cholesterol levels. Diabetes screening. This is done by checking your blood sugar (glucose) after you have not eaten for a while (fasting). Hepatitis B test. Hepatitis C test. HIV (human immunodeficiency virus) test. STI (sexually transmitted infection) testing, if you are at risk. Talk with your health care  provider about your test results, treatment options, and if necessary, the need for more tests. Follow these instructions at home: Eating and drinking  Eat a healthy diet that includes fresh fruits and vegetables, whole grains, lean protein, and low-fat dairy products. Drink enough fluid to keep your urine pale yellow. Take vitamin and mineral supplements as recommended by your health care provider. Do not drink alcohol if your health care provider tells you not to drink. If you drink alcohol: Limit how much you have to 0-2 drinks a day. Know how much alcohol is in your drink. In the [place] one drink equals one 12 oz bottle of beer (355 mL), one 5 oz glass of wine (148 mL), or one 1 oz glass of hard liquor (44 mL). Lifestyle Brush your teeth every morning and night with fluoride toothpaste. Floss one time each day. Exercise for at least 30 minutes 5 or more days each week. Do not use any products that contain nicotine or tobacco. These products include cigarettes, chewing tobacco, and vaping devices, such as e-cigarettes. If you need help quitting, ask your health care provider. Do not use drugs. If you are sexually active, practice safe sex. Use a condom or other form of protection to prevent STIs. Find healthy ways to manage stress, such as: Meditation, yoga, or listening to music. Journaling. Talking to a trusted person. Spending time with friends and family. Minimize exposure to UV radiation to reduce your risk of skin cancer. Safety Always wear your seat belt while driving or riding in a vehicle. Do not drive: If you have been drinking alcohol. Do not ride with someone who has been drinking. If you have been using any mind-altering substances or drugs. While texting. When you are tired or   distracted. Wear a helmet and other protective equipment during sports activities. If you have firearms in your house, make sure you follow all gun safety procedures. Seek help if you have been  physically or sexually abused. What's next? Go to your health care provider once a year for an annual wellness visit. Ask your health care provider how often you should have your eyes and teeth checked. Stay up to date on all vaccines. This information is not intended to replace advice given to you by your health care provider. Make sure you discuss any questions you have with your health care provider. Document Revised: 09/02/2020 Document Reviewed: 09/02/2020 Elsevier Patient Education  2023 [employer] Inc.  

## 2021-08-22 DIAGNOSIS — L259 Unspecified contact dermatitis, unspecified cause: Secondary | ICD-10-CM | POA: Insufficient documentation

## 2021-08-22 DIAGNOSIS — Z6827 Body mass index (BMI) 27.0-27.9, adult: Secondary | ICD-10-CM | POA: Insufficient documentation

## 2022-01-23 DIAGNOSIS — J3489 Other specified disorders of nose and nasal sinuses: Secondary | ICD-10-CM | POA: Insufficient documentation

## 2022-02-21 ENCOUNTER — Ambulatory Visit: Payer: BC Managed Care – PPO | Admitting: Nurse Practitioner

## 2022-12-07 ENCOUNTER — Ambulatory Visit
Admission: EM | Admit: 2022-12-07 | Discharge: 2022-12-07 | Disposition: A | Discharge status: Home / Self Care | Payer: BC Managed Care – PPO

## 2022-12-07 ENCOUNTER — Ambulatory Visit: Payer: BC Managed Care – PPO

## 2022-12-07 DIAGNOSIS — S67195A Crushing injury of left ring finger, initial encounter: Secondary | ICD-10-CM

## 2022-12-07 NOTE — ED Triage Notes (Signed)
"  I injured my left hand, ring finger at gym". "I went to adjust weight and it came down and smashed finger". "Able to move it very little, having some swelling".

## 2022-12-16 ENCOUNTER — Encounter: Payer: Self-pay | Admitting: Physician Assistant

## 2022-12-16 NOTE — ED Provider Notes (Signed)
[facility]    CSN: [id number] Arrival date & time: 12/07/22  1406      History   Chief Complaint Chief Complaint  Patient presents with   Finger Injury    HPI Brendan Lawson is a 42 y.o. male.   Patient here today for evaluation of a left ring finger injury that occurred at the gym earlier.  He states that he went to adjust away and it came down and smashed his finger.  He states he is unable to move his finger very much due to pain and swelling.  He denies any numbness or tingling.  He does not report treatment  The history is provided by the patient.    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   Body mass index (BMI) of 30.0-30.9 in adult 02/08/2021   Encounter for general adult medical examination with abnormal findings 08/10/2020   Herniation of intervertebral disc between L5 and S1 08/10/2020   Mixed hyperlipidemia 08/10/2020   Heat rash 08/10/2020   Encounter to establish care 07/01/2020   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 Authorizing Provider  betamethasone dipropionate 0.05 % lotion Apply 1 Application topically 2 (two) times daily as needed. 08/10/22  Yes [provider]  Cholecalciferol 10 MCG (400 UNIT) CAPS Take 1 capsule by mouth daily. 01/20/22  Yes [provider]  doxycycline (VIBRAMYCIN) 100 MG capsule Take 100 mg by mouth 3 (three) times daily as needed.  08/10/22  Yes [provider]  ibuprofen (ADVIL) 800 MG tablet Take 800 mg by mouth every 8 (eight) hours as needed for moderate pain or cramping.   Yes [provider]  Omega-3 1000 MG CAPS Take 1 capsule by mouth daily at 6 (six) AM. 01/20/22  Yes [provider]  Pediatric Multiple Vitamins (FLINTSTONES PLUS EXTRA C) CHEW Chew 1 tablet by mouth daily. 07/22/21  Yes [provider]  esomeprazole (NEXIUM) 20 MG capsule Take 20 mg by mouth daily at 12 noon.    [provider]  Multiple Vitamin (MULTIVITAMIN WITH MINERALS) TABS tablet Take 1 tablet by mouth daily.    [provider]    Family History Family History  Problem Relation Age of Onset   Cancer Mother        breast    Cancer Father        skin   Cancer Maternal Grandfather        throat    Heart disease Paternal Grandmother    Cancer Other     Social History Social History   Tobacco Use   Smoking status: Never   Smokeless tobacco: Never  Vaping Use   Vaping status: Never Used  Substance Use Topics   Alcohol use: Yes    Comment: social    Drug use: Not Currently    Types: Marijuana  Allergies   Penicillins, Amoxicillin [amoxicillin], and Diazepam   Review of Systems Review of Systems  Constitutional:  Negative for chills and fever.  Eyes:  Negative for discharge and redness.  Musculoskeletal:  Positive for arthralgias and joint swelling.  Skin:  Negative for wound.  Neurological:  Negative for numbness.     Physical Exam Triage Vital Signs ED Triage Vitals  Encounter Vitals Group     BP 12/07/22 1457 132/88     Systolic BP Percentile --      Diastolic BP Percentile --      Pulse Rate 12/07/22 1457 69     Resp 12/07/22 1457 18     Temp 12/07/22 1457 98 F (36.7 C)     Temp Source 12/07/22 1457 Oral     SpO2 12/07/22 1457 97 %     Weight 12/07/22 1455 200 lb (90.7 kg)     Height 12/07/22 1455 5' 9.5" (1.765 m)     Head Circumference --       Peak Flow --      Pain Score 12/07/22 1453 8     Pain Loc --      Pain Education --      Exclude from Growth Chart --    No data found.  Updated Vital Signs BP 132/88 (BP Location: Right Arm)   Pulse 69   Temp 98 F (36.7 C) (Oral)   Resp 18   Ht 5' 9.5" (1.765 m)   Wt 200 lb (90.7 kg)   SpO2 97%   BMI 29.11 kg/m      Physical Exam Vitals and nursing note reviewed.  Constitutional:      General: He is not in acute distress.    Appearance: Normal appearance. He is not ill-appearing.  HENT:     Head: Normocephalic and atraumatic.  Eyes:     Conjunctiva/sclera: Conjunctivae normal.  Cardiovascular:     Rate and Rhythm: Normal rate.  Pulmonary:     Effort: Pulmonary effort is normal. No respiratory distress.  Musculoskeletal:     Comments: Diffuse swelling noted to distal left fourth finger without wound noted, decreased range of motion of finger due to swelling.  Neurological:     Mental Status: He is alert.  Psychiatric:        Mood and Affect: Mood normal.        Behavior: Behavior normal.        Thought Content: Thought content normal.      UC Treatments / Results  Labs (all labs ordered are listed, but only abnormal results are displayed) Labs Reviewed - No data to display  EKG   Radiology No results found.  Procedures Procedures (including critical care time)  Medications Ordered in UC Medications - No data to display  Initial Impression / Assessment and Plan / UC Course  I have reviewed the triage vital signs and the nursing notes.  Pertinent labs & imaging results that were available during my care of the patient were reviewed by me and considered in my medical decision making (see chart for details).     Fracture noted on x-ray.  Recommended follow-up with Ortho.  Recommended splinting until evaluated by Ortho.  Encouraged follow-up with any further concerns in the meantime.   Final Clinical Impressions(s) / UC Diagnoses   Final  diagnoses:  Crushing injury of left ring finger, initial encounter   Discharge Instructions   None    ED Prescriptions   None  PDMP not reviewed this encounter.   Tomi Bamberger, PA-C 12/16/22 [phone]

## 2023-06-03 ENCOUNTER — Emergency Department (HOSPITAL_COMMUNITY)
Admission: EM | Admit: 2023-06-03 | Discharge: 2023-06-03 | Disposition: A | Discharge status: Home / Self Care | Attending: Student | Admitting: Student

## 2023-06-03 ENCOUNTER — Other Ambulatory Visit: Payer: Self-pay

## 2023-06-03 ENCOUNTER — Encounter (HOSPITAL_COMMUNITY): Payer: Self-pay | Admitting: *Deleted

## 2023-06-03 ENCOUNTER — Emergency Department (HOSPITAL_COMMUNITY)

## 2023-06-03 DIAGNOSIS — Y92410 Unspecified street and highway as the place of occurrence of the external cause: Secondary | ICD-10-CM | POA: Insufficient documentation

## 2023-06-03 DIAGNOSIS — S32020A Wedge compression fracture of second lumbar vertebra, initial encounter for closed fracture: Secondary | ICD-10-CM

## 2023-06-03 DIAGNOSIS — S3992XA Unspecified injury of lower back, initial encounter: Secondary | ICD-10-CM | POA: Diagnosis present

## 2023-06-03 DIAGNOSIS — S32029A Unspecified fracture of second lumbar vertebra, initial encounter for closed fracture: Secondary | ICD-10-CM | POA: Insufficient documentation

## 2023-06-03 MED ORDER — NAPROXEN 250 MG PO TABS
500.0000 mg | ORAL_TABLET | Freq: Once | ORAL | Status: AC
  Administered 2023-06-03: 500 mg via ORAL
  Filled 2023-06-03: qty 2

## 2023-06-03 MED ORDER — LIDOCAINE 5 % EX PTCH
2.0000 | MEDICATED_PATCH | CUTANEOUS | Status: DC
  Administered 2023-06-03: 2 via TRANSDERMAL
  Filled 2023-06-03: qty 2

## 2023-06-03 NOTE — Progress Notes (Signed)
 Orthopedic Tech Progress Note Patient Details:  Brendan Lawson 08-02-1980 [id number]  LSO is at bedside as pt wants to wait to put it on until his wife arrives with a shirt. Pt is aware of how to apply/remove it.  Ortho Devices Type of Ortho Device: Lumbar corsett Ortho Device/Splint Location: at bedside after fitting to pt Ortho Device/Splint Interventions: Ordered, Application, Adjustment   Post Interventions Patient Tolerated: Well Instructions Provided: Care of device, Adjustment of device  [name] 06/03/2023, 3:54 PM

## 2023-06-03 NOTE — ED Notes (Signed)
Ortho tech at BS 

## 2023-06-03 NOTE — ED Notes (Signed)
Family into room, at BS.  

## 2023-06-03 NOTE — ED Notes (Signed)
ED PA at BS 
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
## 2023-06-03 NOTE — ED Notes (Signed)
 EDP at [facility]

## 2023-06-03 NOTE — ED Notes (Signed)
 Waiting on LS spine brace
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
## 2023-06-03 NOTE — ED Notes (Signed)
 Ortho tech called for LSO

## 2024-04-23 ENCOUNTER — Telehealth: Payer: Self-pay | Admitting: *Deleted

## 2024-04-23 NOTE — Telephone Encounter (Signed)
 Copied from CRM #[id number]. Topic: Clinical - Request for Lab/Test Order >> Apr 23, 2024  9:58 AM [place] MATSU wrote: Reason for CRM: Patient would like to know if we do any drug screening for nicotine in his system.   May you please advise.

## 2024-04-24 ENCOUNTER — Encounter

## 2024-04-29 ENCOUNTER — Encounter

## 2024-05-24 ENCOUNTER — Encounter
